# Patient Record
Sex: Female | Born: 2000
Health system: Southern US, Community
[De-identification: ages and names within clinical notes are randomized; demographics above are authoritative.]

## PROBLEM LIST (undated history)

## (undated) DIAGNOSIS — E0590 Thyrotoxicosis, unspecified without thyrotoxic crisis or storm: Secondary | ICD-10-CM

## (undated) DIAGNOSIS — G40909 Epilepsy, unspecified, not intractable, without status epilepticus: Secondary | ICD-10-CM

## (undated) HISTORY — PX: APPENDECTOMY: SHX54

## (undated) HISTORY — DX: Epilepsy, unspecified, not intractable, without status epilepticus: G40.909

## (undated) HISTORY — DX: Thyrotoxicosis, unspecified without thyrotoxic crisis or storm: E05.90

---
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
## 2021-07-20 ENCOUNTER — Other Ambulatory Visit: Payer: Self-pay

## 2021-07-20 ENCOUNTER — Encounter (HOSPITAL_COMMUNITY): Payer: Self-pay | Admitting: Emergency Medicine

## 2021-07-20 ENCOUNTER — Emergency Department (HOSPITAL_COMMUNITY)
Admission: EM | Admit: 2021-07-20 | Discharge: 2021-07-21 | Disposition: A | Discharge status: Home / Self Care | Payer: Medicaid Other | Attending: Emergency Medicine | Admitting: Emergency Medicine

## 2021-07-20 DIAGNOSIS — R569 Unspecified convulsions: Secondary | ICD-10-CM | POA: Insufficient documentation

## 2021-07-20 DIAGNOSIS — R000 Tachycardia, unspecified: Secondary | ICD-10-CM | POA: Insufficient documentation

## 2021-07-20 DIAGNOSIS — F1290 Cannabis use, unspecified, uncomplicated: Secondary | ICD-10-CM

## 2021-07-20 DIAGNOSIS — Z7722 Contact with and (suspected) exposure to environmental tobacco smoke (acute) (chronic): Secondary | ICD-10-CM | POA: Insufficient documentation

## 2021-07-20 DIAGNOSIS — E0590 Thyrotoxicosis, unspecified without thyrotoxic crisis or storm: Secondary | ICD-10-CM

## 2021-07-20 NOTE — ED Triage Notes (Addendum)
Per EMS, was in emotional distress from a family argument.  HR went down to170 in SVT.  Was given fluid HR down to 140 and ST.  Took some THC gummies.  Pupils are a 5, does not remember situation.  HR 140-150 18 G L AC CBG 93 130/80 RR 18  Given 500NS  Pt reports she "just woke up and there were people in my room doing tests on me."  Admits to taking "one gummy"  No other complains.

## 2021-07-20 NOTE — ED Provider Notes (Signed)
Emergency Medicine Provider Triage Evaluation Note  Megan Cruz , a 20 y.o. female  was evaluated in triage.  Pt brought in by EMS due to tachycardia. Pt reported took some THC gummies and felt her heart racing. Does not remember the events prior to EMS arrival. Per EMS patient appeared to be in emotional distress, and had HR in th 160-170s. EMS attempted vagal maneuvers for suspected SVT without relief. Gave 500cc fluid bolus and HR came down to 130s with rhythm more consistent with sinus tachycardia.   Patient states that her legs "feel heavy".  Per patient she says that she went to bed at about 4 PM, and woke up with "several people around her doing test".  She believes that her mother called EMS, but she is unsure why.  She states that she has never had a reaction like this to using THC.  No other reported drug use.  She admits to initially feeling like her heart was racing, but states she is not feeling this way anymore.  She is not complaining of any pain.  Review of Systems  Positive: Tachycardia, generalized weakness Negative: Chest pain, shortness of breath, dizziness  Physical Exam  BP 119/90 (BP Location: Right Arm)   Pulse (!) 134   Temp 99.8 F (37.7 C) (Oral)   Resp 16   Ht 5' (1.524 m)   Wt 68 kg   SpO2 100%   BMI 29.29 kg/m  Gen:   Awake, no distress   Resp:  Normal effort  MSK:   Moves extremities without difficulty  Other:  A&O x 4, pupils slightly dilated  Medical Decision Making  Medically screening exam initiated at 7:53 PM.  Appropriate orders placed.  ONEKA PARADA was informed that the remainder of the evaluation will be completed by another provider, this initial triage assessment does not replace that evaluation, and the importance of remaining in the ED until their evaluation is complete.    Jeanella Flattery 07/20/21 1953    Blane Ohara, MD 07/21/21 1154

## 2021-07-21 ENCOUNTER — Telehealth: Payer: Self-pay

## 2021-07-21 ENCOUNTER — Telehealth: Payer: Self-pay | Admitting: Endocrinology

## 2021-07-21 ENCOUNTER — Emergency Department (HOSPITAL_COMMUNITY): Payer: Medicaid Other

## 2021-07-21 LAB — CBC WITH DIFFERENTIAL/PLATELET
Abs Immature Granulocytes: 0.01 10*3/uL (ref 0.00–0.07)
Basophils Absolute: 0 10*3/uL (ref 0.0–0.1)
Basophils Relative: 0 %
Eosinophils Absolute: 0.4 10*3/uL (ref 0.0–0.5)
Eosinophils Relative: 7 %
HCT: 37.9 % (ref 36.0–46.0)
Hemoglobin: 12.3 g/dL (ref 12.0–15.0)
Immature Granulocytes: 0 %
Lymphocytes Relative: 58 %
Lymphs Abs: 3.5 10*3/uL (ref 0.7–4.0)
MCH: 27.2 pg (ref 26.0–34.0)
MCHC: 32.5 g/dL (ref 30.0–36.0)
MCV: 83.8 fL (ref 80.0–100.0)
Monocytes Absolute: 0.7 10*3/uL (ref 0.1–1.0)
Monocytes Relative: 12 %
Neutro Abs: 1.4 10*3/uL — ABNORMAL LOW (ref 1.7–7.7)
Neutrophils Relative %: 23 %
Platelets: 299 10*3/uL (ref 150–400)
RBC: 4.52 MIL/uL (ref 3.87–5.11)
RDW: 12.9 % (ref 11.5–15.5)
WBC: 6 10*3/uL (ref 4.0–10.5)
nRBC: 0 % (ref 0.0–0.2)

## 2021-07-21 LAB — BASIC METABOLIC PANEL
Anion gap: 7 (ref 5–15)
BUN: 6 mg/dL (ref 6–20)
CO2: 21 mmol/L — ABNORMAL LOW (ref 22–32)
Calcium: 8.7 mg/dL — ABNORMAL LOW (ref 8.9–10.3)
Chloride: 109 mmol/L (ref 98–111)
Creatinine, Ser: 0.41 mg/dL — ABNORMAL LOW (ref 0.44–1.00)
GFR, Estimated: >60 mL/min (ref >60)
Glucose, Bld: 88 mg/dL (ref 70–99)
Potassium: 3.9 mmol/L (ref 3.5–5.1)
Sodium: 137 mmol/L (ref 135–145)

## 2021-07-21 LAB — T4, FREE: Free T4: 4.02 ng/dL — ABNORMAL HIGH (ref 0.61–1.12)

## 2021-07-21 LAB — I-STAT BETA HCG BLOOD, ED (MC, WL, AP ONLY): I-stat hCG, quantitative: <5 m[IU]/mL (ref <5)

## 2021-07-21 LAB — TSH: TSH: <0.01 u[IU]/mL — ABNORMAL LOW (ref 0.350–4.500)

## 2021-07-21 MED ORDER — METHIMAZOLE 10 MG PO TABS: 20.0000 mg | ORAL_TABLET | Freq: Two times a day (BID) | ORAL | 2 refills | Status: DC

## 2021-07-21 NOTE — Telephone Encounter (Signed)
Hospital consult given to Dr. Everardo All to call Dr. Blane Ohara per ER attendant.

## 2021-07-21 NOTE — ED Provider Notes (Signed)
[facility] EMERGENCY DEPARTMENT Provider Note   CSN: [id number] Arrival date & time: 07/20/21  1929     History Chief Complaint  Patient presents with   Tachycardia    Megan Cruz is a 20 y.o. female.  Patient with history of seizure-like activity presents after mother noticed seizure-like activity.  There is a family argument and patient recalls going to her bedroom and soon after her mother found her generalized shaking lasting 1 to 2 minutes.  Patient does not recall though specific details but remembers waking up to ambulance people assessing her.  Patient currently has no signs or symptoms.  Patient does admit to using a gummy prior to this episode.  Patient states she does use THC Gummies in the past.  Patient denies any other drug use or alcohol use.  Patient has no blood clot history, not on birth control, no shortness of breath, no chest pain.  Patient had brief seizure-like activity in the past and had an EEG which she was told was unremarkable.  No seizure medications.      History reviewed. No pertinent past medical history.  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 Gerald Stabs, MD;  Location: [place];  Service: Pediatrics;  Laterality: Right;   LAPAROSCOPIC APPENDECTOMY N/A 07/22/2013   Procedure: APPENDECTOMY LAPAROSCOPIC;  Surgeon: Jerilynn Mages. Gerald Stabs, MD;  Location: [place];  Service: Pediatrics;  Laterality: N/A;     OB History     Gravida  0   Para  0   Term  0   Preterm  0   AB  0   Living         SAB  0   IAB  0   Ectopic  0   Multiple      Live Births              Family History  Problem Relation Age of Onset   Migraines Sister     Seizures Neg Hx    Autism Neg Hx    ADD / ADHD Neg Hx    Anxiety disorder Neg Hx    Depression Neg Hx    Bipolar disorder Neg Hx    Schizophrenia Neg Hx     Social History   Tobacco Use   Smoking status: Passive Smoke Exposure - Never Smoker   Smokeless tobacco: Never  Substance Use Topics   Alcohol use: No   Drug use: No    Home Medications Prior to Admission medications   Medication Sig Start Date End Date Taking? Authorizing Provider  HYDROcodone-acetaminophen (NORCO/VICODIN) 5-325 MG per tablet Take 1 tablet by mouth every 6 (six) hours as needed for moderate pain. Patient not taking: Reported on 01/22/2018 07/23/13   Gerald Stabs, MD    Allergies    Patient has no known allergies.  Review of Systems   Review of Systems  Constitutional:  Negative for chills and fever.  HENT:  Negative for congestion.   Eyes:  Negative for visual disturbance.  Respiratory:  Negative for shortness of breath.   Cardiovascular:  Negative for chest pain.  Gastrointestinal:  Negative for abdominal pain and vomiting.  Genitourinary:  Negative for dysuria and flank pain.  Musculoskeletal:  Negative for back pain, neck pain and neck stiffness.  Skin:  Negative for rash.  Neurological:  Positive for seizures. Negative for light-headedness and headaches.  Psychiatric/Behavioral:  The patient is nervous/anxious.    Physical Exam Updated Vital Signs BP 112/78   Pulse (!) 118   Temp 99.1 F (37.3 C)   Resp (!) 30   Ht 5' (1.524 m)   Wt 68 kg   SpO2 98%   BMI 29.29 kg/m   Physical Exam Vitals and nursing note reviewed.  Constitutional:      General: She is not in acute distress.    Appearance: She is well-developed.  HENT:     Head: Normocephalic and atraumatic.     Mouth/Throat:     Mouth: Mucous membranes are moist.  Eyes:     General:        Right eye: No discharge.        Left eye: No discharge.     Conjunctiva/sclera: Conjunctivae normal.  Neck:     Trachea: No  tracheal deviation.  Cardiovascular:     Rate and Rhythm: Regular rhythm. Tachycardia present.     Heart sounds: No murmur heard. Pulmonary:     Effort: Pulmonary effort is normal.     Breath sounds: Normal breath sounds.  Abdominal:     General: There is no distension.     Palpations: Abdomen is soft.     Tenderness: There is no abdominal tenderness. There is no guarding.  Musculoskeletal:     Cervical back: Normal range of motion and neck supple. No rigidity.  Skin:    General: Skin is warm.     Capillary Refill: Capillary refill takes less than 2 seconds.     Findings: No rash.  Neurological:     General: No focal deficit present.     Mental Status: She is alert.     Cranial Nerves: No cranial nerve deficit.     Sensory: No sensory deficit.     Motor: No weakness.     Coordination: Coordination normal.  Psychiatric:        Mood and Affect: Mood normal.    ED Results / Procedures / Treatments   Labs (all labs ordered are listed, but only abnormal results are displayed) Labs Reviewed  CBC WITH DIFFERENTIAL/PLATELET - Abnormal; Notable for the following components:      Result Value   Neutro Abs 1.4 (*)    All other components within normal limits  BASIC METABOLIC PANEL  TSH  I-STAT BETA HCG BLOOD, ED ([facility], [facility], [facility] ONLY)    EKG EKG Interpretation  Date/Time:  Tuesday July 20 2021 19:38:37 EST Ventricular Rate:  130 PR Interval:  158 QRS Duration: 76 QT Interval:  296 QTC Calculation: 435 R Axis:   68 Text Interpretation: Sinus tachycardia Otherwise normal ECG Confirmed by Blane Ohara [phone]) on 07/21/2021 10:37:26 AM  Radiology No results found.  Procedures Procedures   Medications Ordered in ED Medications - No data to display  ED Course  I have reviewed the triage vital signs and the nursing notes.  Pertinent labs & imaging results that were available during my care of the patient were reviewed by me and considered in my medical decision making  (see chart for details).    MDM Rules/Calculators/A&P                           Patient presents with EMS after seizure-like  activity.  Patient was involved in emotional conflict in the home however mother who I received permission from the patient and discussed on speaker phone with witnessed brief shaking episode.  Patient currently has no signs or symptoms.  Patient has been in the ER waiting to be seen all night.  Basic blood work added to check for anemia, electrolyte issues.  Patient admits to [facility] use and I advised against this as there could have been something else in it that led to this event.  No risk factors for blood clots.  Patient comfortable following outpatient discussed with mother as well.  TSH pending, CBC unremarkable normal hemoglobin no signs of anemia, normal white blood cell count.  hCG negative.  Oral fluids given, patient received IV fluid on route.  Heart rate improved from 130s to 90s in the room.  EKG sinus tachycardia.  Delay in pregnancy testing discussed with nursing.  Patient's thyroid studies returned abnormal TSH very low.  Patient's family history of hyperthyroidism as well.  Patient has no symptoms and signs on reassessment normal heart rate.  Discussed with endocrinology on-call will have their office call to set up an appointment.  Pregnancy test reviewed negative.  Endocrinologist recommend starting methimazole twice a day until seen in the office.   Final Clinical Impression(s) / ED Diagnoses Final diagnoses:  Marijuana use  Sinus tachycardia  Seizure-like activity [facility])    Rx / DC Orders ED Discharge Orders     None        Elnora Morrison, MD 07/21/21 [phone]

## 2021-07-21 NOTE — Telephone Encounter (Signed)
I was called by Dr Jodi Mourning: Pt in ER now, with abnormal TFT. Advise methimazole 20-BID.   Avoid pregnancy until advised this is safe.  Our office will call pt for appt here.   This may be months in the future, and will prob be with a different provider.     please contact patient: Needs new pt appt, next avail medicaid appt, any provider.

## 2021-07-21 NOTE — Discharge Instructions (Addendum)
Stay well-hydrated.  Avoid any drug use or alcohol use. Start taking methimazole as directed. Follow-up with neurology for further evaluation.  Avoid driving or operating machinery until cleared. Make sure you do not get pregnant while on this medicine. Follow up with endocrinology ([facility] office) as soon as possible.  Return for shortness of breath, chest pain, passing out, further seizure activity.

## 2021-07-22 ENCOUNTER — Encounter: Payer: Self-pay | Admitting: Neurology

## 2021-07-22 LAB — T3: T3, Total: 425 ng/dL — ABNORMAL HIGH (ref 71–180)

## 2021-07-22 LAB — T3, FREE: T3, Free: 14.4 pg/mL — ABNORMAL HIGH (ref 2.0–4.4)

## 2021-07-27 ENCOUNTER — Encounter: Payer: Self-pay | Admitting: Internal Medicine

## 2021-07-27 ENCOUNTER — Ambulatory Visit: Payer: Medicaid Other | Admitting: Internal Medicine

## 2021-07-27 VITALS — BP 122/74 | HR 80 | Ht 60.0 in | Wt 152.0 lb

## 2021-07-27 DIAGNOSIS — E0590 Thyrotoxicosis, unspecified without thyrotoxic crisis or storm: Secondary | ICD-10-CM

## 2021-07-27 LAB — COMPREHENSIVE METABOLIC PANEL WITH GFR
ALT: 16 U/L (ref 0–35)
AST: 16 U/L (ref 0–37)
Albumin: 3.8 g/dL (ref 3.5–5.2)
Alkaline Phosphatase: 88 U/L (ref 39–117)
BUN: 7 mg/dL (ref 6–23)
CO2: 24 meq/L (ref 19–32)
Calcium: 9.3 mg/dL (ref 8.4–10.5)
Chloride: 107 meq/L (ref 96–112)
Creatinine, Ser: 0.45 mg/dL (ref 0.40–1.20)
GFR: 137.79 mL/min
Glucose, Bld: 130 mg/dL — ABNORMAL HIGH (ref 70–99)
Potassium: 3.8 meq/L (ref 3.5–5.1)
Sodium: 138 meq/L (ref 135–145)
Total Bilirubin: 0.5 mg/dL (ref 0.2–1.2)
Total Protein: 6.4 g/dL (ref 6.0–8.3)

## 2021-07-27 LAB — CBC WITH DIFFERENTIAL/PLATELET
Basophils Absolute: 0 K/uL (ref 0.0–0.1)
Basophils Relative: 0.5 % (ref 0.0–3.0)
Eosinophils Absolute: 0.4 K/uL (ref 0.0–0.7)
Eosinophils Relative: 9.2 % — ABNORMAL HIGH (ref 0.0–5.0)
HCT: 41.4 % (ref 36.0–46.0)
Hemoglobin: 13.5 g/dL (ref 12.0–15.0)
Lymphocytes Relative: 37.3 % (ref 12.0–46.0)
Lymphs Abs: 1.8 K/uL (ref 0.7–4.0)
MCHC: 32.5 g/dL (ref 30.0–36.0)
MCV: 82 fl (ref 78.0–100.0)
Monocytes Absolute: 0.4 K/uL (ref 0.1–1.0)
Monocytes Relative: 8.1 % (ref 3.0–12.0)
Neutro Abs: 2.2 K/uL (ref 1.4–7.7)
Neutrophils Relative %: 44.9 % (ref 43.0–77.0)
Platelets: 333 K/uL (ref 150.0–400.0)
RBC: 5.05 Mil/uL (ref 3.87–5.11)
RDW: 13.2 % (ref 11.5–15.5)
WBC: 4.9 K/uL (ref 4.0–10.5)

## 2021-07-27 LAB — TSH: TSH: <0.01 u[IU]/mL — ABNORMAL LOW (ref 0.35–5.50)

## 2021-07-27 LAB — T4, FREE: Free T4: 2.53 ng/dL — ABNORMAL HIGH (ref 0.60–1.60)

## 2021-07-27 MED ORDER — ATENOLOL 25 MG PO TABS: 25.0000 mg | ORAL_TABLET | Freq: Every day | ORAL | 4 refills | Status: DC

## 2021-07-27 NOTE — Patient Instructions (Signed)
Start Atenolol 25 mg  ONCE daily  Continue Methimazole for now and I will send a portal message about any changes   If you develop severe sore throat with high fevers OR develop unexplained yellowing of your skin, eyes, under your tongue, severe abdominal pain with nausea or vomiting --> then please get evaluated immediately.

## 2021-07-27 NOTE — Progress Notes (Signed)
Name: Megan Cruz  MRN/ DOB: [id number], 04/15/2001    Age/ Sex: 20 y.o., female    PCP: Christel Mormon, MD   Reason for Endocrinology Evaluation: Hyperthyroidism     Date of Initial Endocrinology Evaluation: 07/27/2021     HPI: [name] is a 20 y.o. female with a past medical history of hyperthyrodism. The patient presented for initial endocrinology clinic visit on 07/27/2021 for consultative assistance with her Hyperthyroidism.   She was diagnosed with hyperthyroidism during evaluation of tachycardia on 07/21/2021 with a suppressed TSH <0.01 uIU/mL and elevated FT4 4.02 ng/dL and T3 at 623 ng/dL . She was started on Methimazole at the time.    Prior to starting methimazole she  did not notice any weight loss, local neck symptoms but noted hand tremors . No palpitations except the day she presented to the ED  Denies burning  or change in vision of the eyes but noted pruritis  Denies Biotin intake take  No recent viral infection  LMP 06/27/2021- regular  She is sexually active and does not use contraception , no plans of conception     She is currently on Methimazole 10 mg , 2 tabs BID     Sister with hypothyroidism     HISTORY:  Past Medical History: No past medical history on file. Past Surgical History:  Past Surgical History:  Procedure Laterality Date   APPENDECTOMY     CYST REMOVAL PEDIATRIC Right 07/22/2013   Procedure: CYST REMOVAL PEDIATRIC;  Surgeon: Judie Petit. Leonia Corona, MD;  [facility];  Service: Pediatrics;  Laterality: Right;   LAPAROSCOPIC APPENDECTOMY N/A 07/22/2013   Procedure: APPENDECTOMY LAPAROSCOPIC;  Surgeon: Judie Petit. Leonia Corona, MD;  [facility];  Service: Pediatrics;  Laterality: N/A;    Social History:  reports that she has never smoked. She has been exposed to tobacco smoke. She has never used smokeless tobacco. She reports that she does not drink alcohol and does not use drugs. Family History: family history includes  Migraines in her sister.   HOME MEDICATIONS: Allergies as of 07/27/2021   No Known Allergies      Medication List        Accurate as of July 27, 2021 10:25 AM. If you have any questions, ask your nurse or doctor.          STOP taking these medications    HYDROcodone-acetaminophen 5-325 MG tablet Commonly known as: NORCO/VICODIN Stopped by: Scarlette Shorts, MD       TAKE these medications    methimazole 10 MG tablet Commonly known as: TAPAZOLE Take 2 tablets (20 mg total) by mouth 2 (two) times daily.          REVIEW OF SYSTEMS: A comprehensive ROS was conducted with the patient and is negative except as per HPI     OBJECTIVE:  VS: BP 122/74 (BP Location: Left Arm, Patient Position: Sitting, Cuff Size: Small)   Pulse 80   Ht 5' (1.524 m)   Wt 152 lb (68.9 kg)   LMP 06/27/2021 (Within Days)   SpO2 99%   BMI 29.69 kg/m    Wt Readings from Last 3 Encounters:  07/27/21 152 lb (68.9 kg)  07/20/21 150 lb (68 kg)  01/22/18 139 lb 15.9 oz (63.5 kg) (76 %, Z= 0.72)*   * Growth percentiles are based on CDC (Girls, 2-20 Years) data.     EXAM: General: Pt appears well and is in NAD  Hydration: Well-hydrated with  moist mucous membranes and good skin turgor  Eyes: External eye exam normal without stare, lid lag or exophthalmos.  EOM intact.  PERRL.  Neck: General: Supple without adenopathy. Thyroid: Thyroid size normal.  No goiter or nodules appreciated  Lungs: Clear with good BS bilat with no rales, rhonchi, or wheezes  Heart: Auscultation: Tachycardic  Abdomen: Normoactive bowel sounds, soft, nontender, without masses or organomegaly palpable  Extremities:  BL LE: No pretibial edema normal ROM and strength.  Mental Status: Judgment, insight: Intact Orientation: Oriented to time, place, and person Mood and affect: No depression, anxiety, or agitation     DATA REVIEWED:     Latest Reference Range & Units 07/27/21 10:44  Sodium 135 - 145  mEq/L 138  Potassium 3.5 - 5.1 mEq/L 3.8  Chloride 96 - 112 mEq/L 107  CO2 19 - 32 mEq/L 24  Glucose 70 - 99 mg/dL 528 (H)  BUN 6 - 23 mg/dL 7  Creatinine 4.13 - 2.44 mg/dL 0.10  Calcium 8.4 - 27.2 mg/dL 9.3  Alkaline Phosphatase 39 - 117 U/L 88  Albumin 3.5 - 5.2 g/dL 3.8  AST 0 - 37 U/L 16  ALT 0 - 35 U/L 16  Total Protein 6.0 - 8.3 g/dL 6.4  Total Bilirubin 0.2 - 1.2 mg/dL 0.5  GFR >53.66 mL/min 137.79  WBC 4.0 - 10.5 K/uL 4.9  RBC 3.87 - 5.11 Mil/uL 5.05  Hemoglobin 12.0 - 15.0 g/dL 44.0  HCT 34.7 - 42.5 % 41.4  MCV 78.0 - 100.0 fl 82.0  MCHC 30.0 - 36.0 g/dL 95.6  RDW 38.7 - 56.4 % 13.2  Platelets 150.0 - 400.0 K/uL 333.0    Latest Reference Range & Units 07/27/21 10:44  Neutrophils 43.0 - 77.0 % 44.9  Lymphocytes 12.0 - 46.0 % 37.3  Monocytes Relative 3.0 - 12.0 % 8.1  Eosinophil 0.0 - 5.0 % 9.2 (H)  Basophil 0.0 - 3.0 % 0.5  NEUT# 1.4 - 7.7 K/uL 2.2  Lymphocyte # 0.7 - 4.0 K/uL 1.8  Monocyte # 0.1 - 1.0 K/uL 0.4  Eosinophils Absolute 0.0 - 0.7 K/uL 0.4  Basophils Absolute 0.0 - 0.1 K/uL 0.0  Glucose 70 - 99 mg/dL 332 (H)  TSH 9.51 - 8.84 uIU/mL <0.01 Repeated and verified X2. (L)  T4,Free(Direct) 0.60 - 1.60 ng/dL 1.66 (H)      ASSESSMENT/PLAN/RECOMMENDATIONS:   Hyperthyroidism:  - Pt is clinically hyperthyroid - NO local neck symptoms  -We discussed D/D of Graves' disease, subacute thyroiditis or toxic MNG -We discussed that Graves' Disease is a result of an autoimmune condition involving the thyroid.    We discussed with pt the benefits of methimazole in the Tx of hyperthyroidism, as well as the possible side effects/complications of anti-thyroid drug Tx (specifically detailing the rare, but serious side effect of agranulocytosis). She was informed of need for regular thyroid function monitoring while on methimazole to ensure appropriate dosage without over-treatment. As well, we discussed the possible side effects of methimazole including the chance of  rash, the small chance of liver irritation/juandice and the <=1 in 300-400 chance of sudden onset agranulocytosis.  We discussed importance of going to ED promptly (and stopping methimazole) if shewere to develop significant fever with severe sore throat of other evidence of acute infection.     We extensively discussed the various treatment options for hyperthyroidism and Graves disease including ablation therapy with radioactive iodine versus antithyroid drug treatment versus surgical therapy.  We recommended to the patient that we felt, at this  time, that thionamide therapy would be most optimal.  We discussed the various possible benefits versus side effects of the various therapies.   I carefully explained to the patient that one of the consequences of I-131 ablation treatment would likely be permanent hypothyroidism which would require long-term replacement therapy with LT4.  - Will start B-blocker due to tachycardia and palpitations  - Repeat TFT's today show FT4 trending down, no changes  - TRAb pending   Medications : Continue Methimazole 10 mg , 2 tabs BID Start Atenolol 25 mg daily    F/U in 3 months  Labs in 6 weeks   Signed electronically by: Lyndle Herrlich, MD  [facility]  [facility] [address] [place] [place], [place] [address] Phone: [phone] FAX: [phone]   CC: Christel Mormon, MD [address] [place] [address] Phone: [phone] Fax: [phone]   Return to Endocrinology clinic as below: Future Appointments  Date Time Provider Department Center  07/27/2021 10:30 AM Megan Cruz, Megan Dolores, MD [id number]  08/02/2021  1:00 PM Van Clines, MD [facility] None

## 2021-07-28 DIAGNOSIS — E0590 Thyrotoxicosis, unspecified without thyrotoxic crisis or storm: Secondary | ICD-10-CM | POA: Insufficient documentation

## 2021-07-28 MED ORDER — METHIMAZOLE 10 MG PO TABS: 20.0000 mg | ORAL_TABLET | Freq: Two times a day (BID) | ORAL | 3 refills | Status: DC

## 2021-07-29 LAB — TRAB (TSH RECEPTOR BINDING ANTIBODY): TRAB: 6.37 IU/L — ABNORMAL HIGH (ref <=2.00)

## 2021-08-02 ENCOUNTER — Ambulatory Visit: Payer: Medicaid Other | Admitting: Neurology

## 2021-08-02 ENCOUNTER — Encounter: Payer: Self-pay | Admitting: Neurology

## 2021-08-02 ENCOUNTER — Other Ambulatory Visit: Payer: Self-pay

## 2021-08-02 VITALS — BP 101/62 | HR 90 | Ht 60.0 in | Wt 153.4 lb

## 2021-08-02 DIAGNOSIS — R569 Unspecified convulsions: Secondary | ICD-10-CM | POA: Diagnosis not present

## 2021-08-02 NOTE — Progress Notes (Signed)
NEUROLOGY CONSULTATION NOTE  Megan Cruz MRN: [id number] DOB: 02/17/2001  Referring provider: Dr. Blane Ohara (ER) Primary care provider: Dr. Ivory Broad  Reason for consult:  seizure  Dear Dr Jodi Mourning:  Thank you for your kind referral of Megan Cruz for consultation of the above symptoms. Although her history is well known to you, please allow me to reiterate it for the purpose of our medical record. She is alone in the office today. Records and images were personally reviewed where available.   HISTORY OF PRESENT ILLNESS: This is a 20 year old right-handed woman with a history of newly diagnosed hyperthyroidism presenting for evaluation of seizures. She was evaluated by pediatric neurologist Dr. Devonne Doughty in 01/2018 after an episode of eye rolling, confusion, and brief jerking when her mother woke her up in the middle of the night to take care of a baby. She remembered jerking and was able to go back to sleep. She noted occasional brief jerking in her arms when she wakes up in the morning. EEG at that time was normal. She did well for several years until 07/20/21 when she was brought to the ER for seizure-like activity. There was a family argument and she recalled going to her bedroom where her mother found her having generalized shaking for 1-2 minutes. She denies any prior warning symptoms. She woke up in her bed and found she bit her tongue and both legs were weak. She admitted to taking a THC gummy prior to the event, which she has had in the past with no issues. She was tachycardic in the 160s-170s, given IV fluids.  Bloodwork showed normal CBC, BMP. Her TSH was very low at <0.010, free T4 was 4.02, T3 425, she was started on methimazole. She was seen by Endocrinology and started on atenolol. She lives with her mother. She denies any staring/unresponsive episodes, gaps in time, olfactory/gustatory hallucinations, deja vu, rising epigastric sensation, focal  numbness/tingling/weakness. She denies any headaches, dizziness, diplopia, dysarthria/dysphagia, neck/back pain, bowel/bladder dysfunction. Sleep is good until recently with her thyroid medications, she wakes up at 2am but goes back to sleep. She works at [employer]. Her brother had a single seizure. Otherwise she had a normal birth and early development.  There is no history of febrile convulsions, CNS infections such as meningitis/encephalitis, significant traumatic brain injury, neurosurgical procedures.  PAST MEDICAL HISTORY: Past Medical History:  Diagnosis Date   Hyperthyroidism     PAST SURGICAL HISTORY: Past Surgical History:  Procedure Laterality Date   APPENDECTOMY     CYST REMOVAL PEDIATRIC Right 07/22/2013   Procedure: CYST REMOVAL PEDIATRIC;  Surgeon: Judie Petit. Leonia Corona, MD;  [facility];  Service: Pediatrics;  Laterality: Right;   LAPAROSCOPIC APPENDECTOMY N/A 07/22/2013   Procedure: APPENDECTOMY LAPAROSCOPIC;  Surgeon: Judie Petit. Leonia Corona, MD;  Location[facility];  Service: Pediatrics;  Laterality: N/A;    MEDICATIONS: Current Outpatient Medications on File Prior to Visit  Medication Sig Dispense Refill   atenolol (TENORMIN) 25 MG tablet Take 1 tablet (25 mg total) by mouth daily. 30 tablet 4   methimazole (TAPAZOLE) 10 MG tablet Take 2 tablets (20 mg total) by mouth 2 (two) times daily. 120 tablet 3   No current facility-administered medications on file prior to visit.    ALLERGIES: No Known Allergies  FAMILY HISTORY: Family History  Problem Relation Age of Onset   Migraines Sister    Seizures Neg Hx    Autism Neg Hx    ADD / ADHD Neg  Hx    Anxiety disorder Neg Hx    Depression Neg Hx    Bipolar disorder Neg Hx    Schizophrenia Neg Hx     SOCIAL HISTORY: Social History   Socioeconomic History   Marital status: Single    Spouse name: Not on file   Number of children: Not on file   Years of education: Not on file   Highest education level: Not on file   Occupational History   Not on file  Tobacco Use   Smoking status: Never    Passive exposure: Yes   Smokeless tobacco: Never  Substance and Sexual Activity   Alcohol use: No   Drug use: No   Sexual activity: Never    Birth control/protection: None  Other Topics Concern   Not on file  Social History Narrative   Patient lives with mom and brother. She has graduated and is not attending college at the moment.        Social Determinants of Health   Financial Resource Strain: Not on file  Food Insecurity: Not on file  Transportation Needs: Not on file  Physical Activity: Not on file  Stress: Not on file  Social Connections: Not on file  Intimate Partner Violence: Not on file     PHYSICAL EXAM: Vitals:   08/02/21 1257  BP: 101/62  Pulse: 90  SpO2: 99%   General: No acute distress Head:  Normocephalic/atraumatic Skin/Extremities: No rash, no edema Neurological Exam: Mental status: alert and oriented to person, place, and time, no dysarthria or aphasia, Fund of knowledge is appropriate.  Recent and remote memory are intact, 3/3 delayed recall.  Attention and concentration are normal, 5/5 WORLD backwards.  Cranial nerves: CN I: not tested CN II: pupils equal, round and reactive to light, visual fields intact CN III, IV, VI:  full range of motion, no nystagmus, no ptosis CN V: facial sensation intact CN VII: upper and lower face symmetric CN VIII: hearing intact to conversation Bulk & Tone: normal, no fasciculations. Motor: 5/5 throughout with no pronator drift. Sensation: intact to light touch, cold, pin, vibration and joint position sense.  No extinction to double simultaneous stimulation.  Romberg test negative Deep Tendon Reflexes: brisk +2 throughout Cerebellar: no incoordination on finger to nose testing Gait: narrow-based and steady, able to tandem walk adequately. Tremor: none   IMPRESSION: This is a 20 year old right-handed woman with a history of newly  diagnosed hyperthyroidism presenting for evaluation of seizures. She had an episode of body jerking in 2019, EEG at that time normal. She was event-free for 3 years until 07/20/21 when her mother witnessed a GTC. 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 [place] driving laws were discussed with the patient, and she knows to stop driving after a seizure, until 6 months seizure-free. Follow-up in 4 months, call for any changes.    Thank you for allowing me to participate in the care of this patient. Please do not hesitate to call for any questions or concerns.   Patrcia Dolly, M.D.  CC: Dr. Jodi Mourning, Dr. Sabino Dick

## 2021-08-02 NOTE — Patient Instructions (Signed)
Good to meet you.  Schedule MRI brain with and without contrast  2. Schedule 1-hour EEG  3. Follow-up in 4 months, call for any changes   Seizure Precautions: 1. If medication has been prescribed for you to prevent seizures, take it exactly as directed.  Do not stop taking the medicine without talking to your doctor first, even if you have not had a seizure in a long time.   2. Avoid activities in which a seizure would cause danger to yourself or to others.  Don't operate dangerous machinery, swim alone, or climb in high or dangerous places, such as on ladders, roofs, or girders.  Do not drive unless your doctor says you may.  3. If you have any warning that you may have a seizure, lay down in a safe place where you can't hurt yourself.    4.  No driving for 6 months from last seizure, as per [facility].   Please refer to the following link on the Epilepsy Foundation of [employer]'s website for more information: http://www.epilepsyfoundation.org/answerplace/Social/driving/drivingu.cfm   5.  Maintain good sleep hygiene. Avoid alcohol.  6.  Notify your neurology if you are planning pregnancy or if you become pregnant.  7.  Contact your doctor if you have any problems that may be related to the medicine you are taking.  8.  Call 911 and bring the patient back to the ED if:        A.  The seizure lasts longer than 5 minutes.       B.  The patient doesn't awaken shortly after the seizure  C.  The patient has new problems such as difficulty seeing, speaking or moving  D.  The patient was injured during the seizure  E.  The patient has a temperature over 102 F (39C)  F.  The patient vomited and now is having trouble breathing

## 2021-08-03 ENCOUNTER — Ambulatory Visit: Payer: Medicaid Other | Admitting: Neurology

## 2021-08-03 ENCOUNTER — Telehealth: Payer: Self-pay | Admitting: Neurology

## 2021-08-03 DIAGNOSIS — R569 Unspecified convulsions: Secondary | ICD-10-CM | POA: Diagnosis not present

## 2021-08-03 NOTE — Telephone Encounter (Signed)
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 Patient called, left VM to discuss EEG results.

## 2021-08-04 ENCOUNTER — Other Ambulatory Visit: Payer: Medicaid Other

## 2021-08-04 MED ORDER — LEVETIRACETAM ER 500 MG PO TB24: ORAL_TABLET | ORAL | 6 refills | Status: DC

## 2021-08-04 NOTE — Telephone Encounter (Signed)
Discussed EEG results with patient, recommend starting Keppra XR 500mg : take 1 tablet every night for 1 week, then increase to 2 tablets every night. Side effects discussed. F/u as scheduled in 4 months, she knows to call for any changes.

## 2021-08-06 ENCOUNTER — Encounter: Payer: Self-pay | Admitting: Neurology

## 2021-08-09 ENCOUNTER — Telehealth: Payer: Self-pay | Admitting: Internal Medicine

## 2021-08-09 NOTE — Telephone Encounter (Signed)
Nurse Assessment Nurse: Johnna Acosta, RN, Helmut Muster Date/Time Lamount Cohen Time): 08/09/2021 12:04:34 PM Confirm and document reason for call. If symptomatic, describe symptoms. ---Caller states she has itching all over after taking her Atenolol. Areas are red. Caller states she read a message on MyChart that if this happens while taking this medication to stop taking for a week to see if the symptoms improve. Caller denies any other new or worsening symptoms. Caller refused triage at this time. Does the patient have any new or worsening symptoms? ---Yes Will a triage be completed? ---No Select reason for no triage. ---Patient declined Disp. Time Lamount Cohen Time) Disposition Final User 08/09/2021 11:38:51 AM Attempt made - message left Johnna Acosta, RN, Helmut Muster 08/09/2021 11:50:06 AM Attempt made - message left Theodore Demark 08/09/2021 12:07:13 PM Clinical Call Yes Johnna Acosta, RN, Helmut Muster Comments User: Donnelly Angelica Date/Time Lamount Cohen Time): 08/09/2021 11:34:53 AM Caller transferred from the office

## 2021-08-09 NOTE — Telephone Encounter (Signed)
Left vm for patient and also sent a mychart message with information.

## 2021-08-09 NOTE — Telephone Encounter (Signed)
Pt stated she thinks methimazole (TAPAZOLE) 10 MG tablet  and atenolol (TENORMIN) 25 MG tablet  is making her very itchy, she was transferred to triage. Also stated she thinks she may be pregnant but it is very hard to schedule with her pcp.

## 2021-08-10 ENCOUNTER — Encounter: Payer: Self-pay | Admitting: Internal Medicine

## 2021-08-10 MED ORDER — PROPYLTHIOURACIL 50 MG PO TABS: 50.0000 mg | ORAL_TABLET | Freq: Two times a day (BID) | ORAL | 1 refills | Status: DC

## 2021-08-10 NOTE — Procedures (Signed)
ELECTROENCEPHALOGRAM REPORT  Date of Study: 08/03/2021  Patient's Name: Megan Cruz MRN: [id number] Date of Birth: 23-Nov-2000  Referring Provider: Dr. Patrcia Dolly  Clinical History: This is a 20 year old woman with witnessed convulsion on 07/20/21. EEG for classification.  Medications: TENORMIN 25 MG tablet TAPAZOLE 10 MG tablet  Technical Summary: A multichannel digital 1-hour EEG recording measured by the international 10-20 system with electrodes applied with paste and impedances below 5000 ohms performed in our laboratory with EKG monitoring in an awake and asleep patient.  Hyperventilation was not performed. Photic stimulation was performed.  The digital EEG was referentially recorded, reformatted, and digitally filtered in a variety of bipolar and referential montages for optimal display.    Description: The patient is awake and asleep during the recording.  During maximal wakefulness, there is a symmetric, medium voltage 10.5 Hz posterior dominant rhythm that attenuates with eye opening.  The record is symmetric.  During drowsiness and sleep, there is an increase in theta slowing of the background.  Vertex waves and symmetric sleep spindles were seen.  Photic stimulation did not elicit any abnormalities.  There were occasional high voltage irregular generalized 4-5 Hz spike and polyspike and wave discharges with frontal predominance seen lasting 1-3 seconds without clinical correlate.There were no electrographic seizures seen.    EKG lead was unremarkable.  Impression: This 1-hour awake and asleep EEG is abnormal due to the presence of occasional generalized high voltage 4-5 Hz spike and polyspike and wave discharges lasting 1-3 seconds.  Clinical Correlation of the above findings is consistent with a diagnosis of primary generalized epilepsy. Clinical correlation is advised.   Patrcia Dolly, M.D.

## 2021-08-22 NOTE — L&D Delivery Note (Signed)
[facility]N [facility] Delivery Note  Megan Cruz is a 21 y.o. G1P0000 s/p SVD at [redacted]w[redacted]d. She was admitted for IOL due to hyperthyroidism.   ROM: 7h 69m with clear fluid GBS Status:  Negative/-- (07/24 1423) Maximum Maternal Temperature: afebrile  Labor Progress: Initial SVE: 1cm. She then progressed to complete.   Delivery Date/Time:  Delivery: Called to room and patient was complete and pushing. Head delivered LOA. No nuchal cord present. Shoulder and body delivered in usual fashion. Infant with spontaneous cry, placed on mother's abdomen, dried and stimulated. Cord clamped x 2 after 1-minute delay, and cut by father of the baby. Cord blood drawn. Placenta delivered spontaneously with gentle cord traction. Fundus firm with massage and Pitocin. Labia, perineum, vagina, and cervix inspected inspected with 2nd degree vaginal laceration.  Baby Weight: pending  Placenta: Sent to L&D Complications: None Lacerations: 2nd degree, repaired with 2-0 monocryl EBL: 257 mL Analgesia: Epidural   Infant:  APGAR (1 MIN): 9   APGAR (5 MINS): 9   APGAR (10 MINS):     [name], DO [facility] 04/04/2022, 10:59 AM

## 2021-08-24 ENCOUNTER — Ambulatory Visit
Admission: RE | Admit: 2021-08-24 | Discharge: 2021-08-24 | Disposition: A | Discharge status: Home / Self Care | Payer: Medicaid Other | Source: Ambulatory Visit | Attending: Neurology | Admitting: Neurology

## 2021-08-24 ENCOUNTER — Other Ambulatory Visit: Payer: Self-pay

## 2021-08-24 DIAGNOSIS — R569 Unspecified convulsions: Secondary | ICD-10-CM

## 2021-08-24 MED ORDER — GADOBENATE DIMEGLUMINE 529 MG/ML IV SOLN
14.0000 mL | Freq: Once | INTRAVENOUS | Status: AC | PRN
  Administered 2021-08-24: 14 mL via INTRAVENOUS

## 2021-09-07 ENCOUNTER — Other Ambulatory Visit (INDEPENDENT_AMBULATORY_CARE_PROVIDER_SITE_OTHER): Payer: Medicaid Other

## 2021-09-07 DIAGNOSIS — E0590 Thyrotoxicosis, unspecified without thyrotoxic crisis or storm: Secondary | ICD-10-CM

## 2021-09-08 LAB — TSH: TSH: 0 u[IU]/mL — ABNORMAL LOW (ref 0.35–5.50)

## 2021-09-08 LAB — T4, FREE: Free T4: 1.28 ng/dL (ref 0.60–1.60)

## 2021-09-09 ENCOUNTER — Telehealth: Payer: Self-pay | Admitting: Neurology

## 2021-09-09 MED ORDER — LEVETIRACETAM ER 500 MG PO TB24: ORAL_TABLET | ORAL | 6 refills | Status: DC

## 2021-09-09 NOTE — Addendum Note (Signed)
Addended by: Van Clines on: 09/09/2021 04:09 PM   Modules accepted: Orders

## 2021-09-09 NOTE — Telephone Encounter (Signed)
Pt called an informed increase the Keppra XR 500mg : Take 1 tablet in AM, 2 tablets in PM.  Pt reminded  that most common trigger for seizures is missing medication, keep an alarm and pillbox. Also pls let her know brain MRI did not show any tumor, stroke, or bleed. Will put on waitlist,

## 2021-09-09 NOTE — Telephone Encounter (Signed)
Pt c/o: seizure Missed medications?  Yes.   One dose Sleep deprived?  No. Alcohol intake?  No. Back to their usual baseline self?  Yes.  . Just a head ache  Any increase in stress? No Any triggers? No Current medications prescribed by Dr. Karel Jarvis: levetriacetam 500 mg  2 tablets every night

## 2021-09-09 NOTE — Telephone Encounter (Signed)
Patient called and said she had another seizure today and hasn't had another one since before her last appointment.   She said she called an ambulance and is doing okay now with no injuries.  Patient is requesting to be seen sooner than 12/14/21, if needed. Please advise?

## 2021-09-09 NOTE — Telephone Encounter (Signed)
Pls have her increase the Keppra XR 500mg : Take 1 tablet in AM, 2 tablets in PM. Pls send in updated Rx. Pls remind her that most common trigger for seizures is missing medication, keep an alarm and pillbox. Also pls let her know brain MRI did not show any tumor, stroke, or bleed. Will put on waitlist, thanks

## 2021-10-07 ENCOUNTER — Encounter: Payer: Self-pay | Admitting: Neurology

## 2021-10-07 ENCOUNTER — Other Ambulatory Visit: Payer: Self-pay | Admitting: Internal Medicine

## 2021-10-07 ENCOUNTER — Ambulatory Visit: Payer: Medicaid Other | Admitting: Neurology

## 2021-10-07 ENCOUNTER — Other Ambulatory Visit (INDEPENDENT_AMBULATORY_CARE_PROVIDER_SITE_OTHER): Payer: Medicaid Other

## 2021-10-07 ENCOUNTER — Other Ambulatory Visit: Payer: Self-pay

## 2021-10-07 VITALS — BP 114/78 | HR 94 | Ht 60.0 in | Wt 154.8 lb

## 2021-10-07 DIAGNOSIS — G40B09 Juvenile myoclonic epilepsy, not intractable, without status epilepticus: Secondary | ICD-10-CM | POA: Diagnosis not present

## 2021-10-07 DIAGNOSIS — Z3490 Encounter for supervision of normal pregnancy, unspecified, unspecified trimester: Secondary | ICD-10-CM

## 2021-10-07 NOTE — Progress Notes (Signed)
NEUROLOGY FOLLOW UP OFFICE NOTE  Megan Cruz [id number] 2001/05/28  HISTORY OF PRESENT ILLNESS: I had the pleasure of seeing Megan Cruz in follow-up in the neurology clinic on 10/07/2021.  The patient was last seen 2 months ago for seizures. She is alone in the office today.  Records and images were personally reviewed where available.  She had an episode concerning for seizure in 2019, event-free for 3 years until she had a witnessed GTC in 06/2021. I personally reviewed brain MRI with and without contrast done 08/2021 which did not show any acute changes, hippocampi symmetric with no abnormal signal or enhancement seen. There was note of very small foci of increased T2 FLAIR signal in the bilateral cerebral white matter. There was prominence of palatine tonsils and lymph nodes in the upper neck. Her 1-hour EEG in 07/2021 was abnormal due to occasional generalized high voltage 4-5 Hz spike and polyspike and wave discharges lasting 1-3 seconds consistent with a primary generalized epilepsy. She was started on Levetiracetam ER 1000mg  qhs in 07/2021, then called to report another seizure on 09/09/2021, Levetiracetam ER increased to 500mg  in AM, 1000mg  in PM. She had a convulsion during her nap at 4pm and bit her tongue. She denies any further seizures in the past month, no myoclonic jerks, staring/unresponsive episodes, olfactory/gustatory hallucinations, focal numbness/tingling/weakness. She reports that her LMP was in December and that she has taken a home pregnancy test which is positive. She has not seen OB yet. She feels okay with some nausea. She works at . She gets 4-5 hours of sleep then takes a nap in the afternoon after finishing her shift at [employer]. Mood is good.    History on Initial Assessment 08/02/2021: This is a 21 year old right-handed woman with a history of newly diagnosed hyperthyroidism presenting for evaluation of seizures. She was evaluated by pediatric neurologist  Dr. [employer] in 01/2018 after an episode of eye rolling, confusion, and brief jerking when her mother woke her up in the middle of the night to take care of a baby. She remembered jerking and was able to go back to sleep. She noted occasional brief jerking in her arms when she wakes up in the morning. EEG at that time was normal. She did well for several years until 07/20/21 when she was brought to the ER for seizure-like activity. There was a family argument and she recalled going to her bedroom where her mother found her having generalized shaking for 1-2 minutes. She denies any prior warning symptoms. She woke up in her bed and found she bit her tongue and both legs were weak. She admitted to taking a THC gummy prior to the event, which she has had in the past with no issues. She was tachycardic in the 160s-170s, given IV fluids.  Bloodwork showed normal CBC, BMP. Her TSH was very low at <0.010, free T4 was 4.02, T3 425, she was started on methimazole. She was seen by Endocrinology and started on atenolol. She lives with her mother. She denies any staring/unresponsive episodes, gaps in time, olfactory/gustatory hallucinations, deja vu, rising epigastric sensation, focal numbness/tingling/weakness. She denies any headaches, dizziness, diplopia, dysarthria/dysphagia, neck/back pain, bowel/bladder dysfunction. Sleep is good until recently with her thyroid medications, she wakes up at 2am but goes back to sleep. She works at [employer]. Her brother had a single seizure. Otherwise she had a normal birth and early development.  There is no history of febrile convulsions, CNS infections such as meningitis/encephalitis, significant traumatic brain  injury, neurosurgical procedures.   PAST MEDICAL HISTORY: Past Medical History:  Diagnosis Date   Hyperthyroidism     MEDICATIONS: Current Outpatient Medications on File Prior to Visit  Medication Sig Dispense Refill   levETIRAcetam (KEPPRA XR) 500 MG 24 hr tablet  Take 1 tablet in AM, 2 tablets in PM 90 tablet 6   No current facility-administered medications on file prior to visit.    ALLERGIES: No Known Allergies  FAMILY HISTORY: Family History  Problem Relation Age of Onset   Migraines Sister    Seizures Neg Hx    Autism Neg Hx    ADD / ADHD Neg Hx    Anxiety disorder Neg Hx    Depression Neg Hx    Bipolar disorder Neg Hx    Schizophrenia Neg Hx     SOCIAL HISTORY: Social History   Socioeconomic History   Marital status: Single    Spouse name: Not on file   Number of children: Not on file   Years of education: Not on file   Highest education level: Not on file  Occupational History   Not on file  Tobacco Use   Smoking status: Never    Passive exposure: Yes   Smokeless tobacco: Never  Vaping Use   Vaping Use: Never used  Substance and Sexual Activity   Alcohol use: No   Drug use: No   Sexual activity: Never    Birth control/protection: None  Other Topics Concern   Not on file  Social History Narrative   Patient lives with mom and brother. She has graduated and is not attending college at the moment.    Right handed        Social Determinants of Health   Financial Resource Strain: Not on file  Food Insecurity: Not on file  Transportation Needs: Not on file  Physical Activity: Not on file  Stress: Not on file  Social Connections: Not on file  Intimate Partner Violence: Not on file     PHYSICAL EXAM: Vitals:   10/07/21 0955  BP: 114/78  Pulse: 94  SpO2: 99%   General: No acute distress Head:  Normocephalic/atraumatic Skin/Extremities: No rash, no edema Neurological Exam: alert and awake. No aphasia or dysarthria. Fund of knowledge is appropriate. Attention and concentration are normal.   Cranial nerves: Pupils equal, round. Extraocular movements intact with no nystagmus. Visual fields full.  No facial asymmetry.  Motor: Bulk and tone normal, muscle strength 5/5 throughout with no pronator drift.   Finger to  nose testing intact.  Gait narrow-based and steady, able to tandem walk adequately.  Romberg negative.   IMPRESSION: This is a pleasant 21 yo RH woman with a history of hyperthyroidism, with Juvenile Myoclonic Epilepsy. EEG showed generalized 4-5 Hz spike and polyspike and wave discharges. MRI brain unremarkable. Last nocturnal seizure 09/09/2021, on increased dose now of Levetiracetam ER 500mg  in AM, 1000mg  in PM. She reports today that she had a positive home pregnancy test with LMP in December 2022. She has not seen OB yet. Discussed issues in women with epilepsy and pregnancy, will need to check Keppra level every month, check level today. Start daily prenatal vitamins and folic acid 1mg  daily. She is aware of [place] driving laws to stop driving after a seizure until 6 months seizure-free. She is asking for a referral to OB.  Follow-up in 3 months, call for any changes.  Thank you for allowing me to participate in her care.  Please do not hesitate to  call for any questions or concerns.   Patrcia Dolly, M.D.   CC: Dr. Sabino Dick

## 2021-10-07 NOTE — Patient Instructions (Signed)
Good to see you!  Check Keppra level today. You will need to have monthly bloodwork for Keppra level  2. Continue Keppra XR (Levetiracetam ER) 500mg : Take 1 tablet in AM, 2 tablets in PM  3. Start a daily prenatal vitamin and also a daily folic acid 1mg  tablet  4. Referral will be sent to OB  5. Follow-up in 3 months, call for any changes   Seizure Precautions: 1. If medication has been prescribed for you to prevent seizures, take it exactly as directed.  Do not stop taking the medicine without talking to your doctor first, even if you have not had a seizure in a long time.   2. Avoid activities in which a seizure would cause danger to yourself or to others.  Don't operate dangerous machinery, swim alone, or climb in high or dangerous places, such as on ladders, roofs, or girders.  Do not drive unless your doctor says you may.  3. If you have any warning that you may have a seizure, lay down in a safe place where you can't hurt yourself.    4.  No driving for 6 months from last seizure, as per [facility].   Please refer to the following link on the [place] website for more information: http://www.epilepsyfoundation.org/answerplace/Social/driving/drivingu.cfm   5.  Maintain good sleep hygiene. Avoid alcohol  6.  Notify your neurology if you are planning pregnancy or if you become pregnant.  7.  Contact your doctor if you have any problems that may be related to the medicine you are taking.  8.  Call 911 and bring the patient back to the ED if:        A.  The seizure lasts longer than 5 minutes.       B.  The patient doesn't awaken shortly after the seizure  C.  The patient has new problems such as difficulty seeing, speaking or moving  D.  The patient was injured during the seizure  E.  The patient has a temperature over 102 F (39C)  F.  The patient vomited and now is having trouble breathing

## 2021-10-13 LAB — LEVETIRACETAM LEVEL: Keppra (Levetiracetam): 6.9 ug/mL — ABNORMAL LOW

## 2021-10-14 ENCOUNTER — Other Ambulatory Visit: Payer: Self-pay | Admitting: Neurology

## 2021-10-14 ENCOUNTER — Telehealth: Payer: Self-pay

## 2021-10-14 ENCOUNTER — Other Ambulatory Visit: Payer: Self-pay

## 2021-10-14 ENCOUNTER — Ambulatory Visit (INDEPENDENT_AMBULATORY_CARE_PROVIDER_SITE_OTHER): Payer: Medicaid Other

## 2021-10-14 VITALS — BP 121/78 | HR 100 | Ht 60.0 in | Wt 154.0 lb

## 2021-10-14 DIAGNOSIS — Z3201 Encounter for pregnancy test, result positive: Secondary | ICD-10-CM | POA: Diagnosis not present

## 2021-10-14 LAB — POCT URINE PREGNANCY: Preg Test, Ur: POSITIVE — AB

## 2021-10-14 MED ORDER — LEVETIRACETAM ER 500 MG PO TB24: ORAL_TABLET | ORAL | 6 refills | Status: DC

## 2021-10-14 NOTE — Telephone Encounter (Signed)
Pt called an informed Keppra level is low, we want her protected especially during pregnancy. Increase Keppra ER 500mg : Take 2 tablets in AM, 2 tablets in PM. I sent in the Rx. She will need re-check of Keppra level next month. Pt verbalized understanding

## 2021-10-14 NOTE — Telephone Encounter (Signed)
-----   Message from Van Clines, MD sent at 10/14/2021  9:12 AM EST ----- Pls let her know the Keppra level is low, we want her protected especially during pregnancy. Increase Keppra ER 500mg : Take 2 tablets in AM, 2 tablets in PM. I sent in the Rx. She will need re-check of Keppra level next month. Thanks

## 2021-10-14 NOTE — Progress Notes (Signed)
Megan Cruz presents today for UPT. She has no unusual complaints. LMP:06/27/2021    OBJECTIVE: Appears well, in no apparent distress.  OB History     Gravida  1   Para  0   Term  0   Preterm  0   AB  0   Living         SAB  0   IAB  0   Ectopic  0   Multiple      Live Births             Home UPT Result: POSITIVE In-Office UPT result: POSITIVE  I have reviewed the patient's medical, obstetrical, social, and family histories, and medications.   ASSESSMENT: Positive pregnancy test LMP 06/27/2021 EDD  04/03/2022 [place]     [redacted]w[redacted]d  PLAN Prenatal care to be completed at: [facility]

## 2021-10-15 NOTE — Progress Notes (Signed)
Patient was assessed and managed by nursing staff during this encounter. I have reviewed the chart and agree with the documentation and plan. I have also made any necessary editorial changes. ° °Rollen Selders A Stuart Mirabile, MD °10/15/2021 11:26 AM   °

## 2021-10-20 ENCOUNTER — Ambulatory Visit (INDEPENDENT_AMBULATORY_CARE_PROVIDER_SITE_OTHER): Payer: Medicaid Other | Admitting: *Deleted

## 2021-10-20 DIAGNOSIS — Z3480 Encounter for supervision of other normal pregnancy, unspecified trimester: Secondary | ICD-10-CM

## 2021-10-20 DIAGNOSIS — O0990 Supervision of high risk pregnancy, unspecified, unspecified trimester: Secondary | ICD-10-CM | POA: Insufficient documentation

## 2021-10-20 MED ORDER — BLOOD PRESSURE MONITOR KIT
PACK | 0 refills | Status: DC
Start: 2021-10-20 — End: 2022-05-25

## 2021-10-20 MED ORDER — GOJJI WEIGHT SCALE MISC: 0 refills | Status: DC

## 2021-10-20 NOTE — Progress Notes (Signed)
New OB Intake ? ?I connected with  Megan Cruz on 10/20/21 at  2:00 PM EST by telephone Video Visit and verified that I am speaking with the correct person using two identifiers. Nurse is located at [facility] and pt is located at Home. ? ?I discussed the limitations, risks, security and privacy concerns of performing an evaluation and management service by telephone and the availability of in person appointments. I also discussed with the patient that there may be a patient responsible charge related to this service. The patient expressed understanding and agreed to proceed. ? ?I explained I am completing New OB Intake today. We discussed her EDD of 04/03/22 that is based on LMP of 06/27/21. Pt is G1/P0. I reviewed her allergies, medications, Medical/Surgical/OB history, and appropriate screenings. I informed her of [facility] services. Based on history, this is a  pregnancy uncomplicated .  ? ?Patient Active Problem List  ? Diagnosis Date Noted  ? Hyperthyroidism 07/28/2021  ? Seizure-like activity ([place]) 01/22/2018  ? 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 Acute gangrenous appendicitis 07/22/2013  ? ? ?Concerns addressed today ? ?Delivery Plans:  ?Plans to deliver at [facility].  ? ?Waterbirth candidate?  ? ?MyChart/Babyscripts ?MyChart access verified. I explained pt will have some visits in office and some virtually. Babyscripts instructions given and order placed. Patient verifies receipt of registration text/e-mail. Account successfully created and app downloaded. ? ?Blood Pressure Cuff  ?Blood pressure cuff ordered for patient to pick-up from [employer]. Explained after first prenatal appt pt will check weekly and document in 36. ? ?Weight scale: Patient does not  have weight scale. Weight scale ordered for patient to pick up from [employer].  ? ?Anatomy US ?Explained first scheduled [place] will be around 19 weeks.  ?Scheduled AFP lab only appointment if CenteringPregnancy pt for same day as anatomy US.   ? ?Labs ?Discussed Johnsie Cancel genetic screening with patient. Would like both Panorama and Horizon drawn at new OB visit.Also if interested in genetic testing, tell patient she will need AFP 15-21 weeks to complete genetic testing .Routine prenatal labs needed. ? ?Covid Vaccine ?Patient has covid vaccine.  ? ?Is patient a CenteringPregnancy candidate? Not a candidate  ? ?Is patient a Mom+Baby Combined Care candidate? Not a candidate    ? ?Informed patient of [facility] website  and placed link in her AVS.  ? ?Social Determinants of Health ?Food Insecurity: Patient denies food insecurity. ?WIC Referral: Patient is interested in referral to [facility].  ?Transportation: Patient denies transportation needs. ?Childcare: Discussed no children allowed at ultrasound appointments. Offered childcare services; patient declines childcare services at this time. ? ?Send link to Pregnancy Navigators ? ? ?Placed OB Box on problem list and updated ? ?First visit review ?I reviewed new OB appt with pt. I explained she will have a pelvic exam, ob bloodwork with genetic screening, and PAP smear. Explained pt will be seen by Dr Roselie Awkward at first visit; encounter routed to appropriate provider. Explained that patient will be seen by pregnancy navigator following visit with provider. [facility] information placed in AVS.  ? ?Lewie Loron Dellion, CMA ?10/20/2021  2:10 PM ? ?

## 2021-10-22 ENCOUNTER — Other Ambulatory Visit: Payer: Self-pay | Admitting: Internal Medicine

## 2021-10-26 ENCOUNTER — Other Ambulatory Visit: Payer: Self-pay

## 2021-10-26 ENCOUNTER — Encounter: Payer: Self-pay | Admitting: Obstetrics & Gynecology

## 2021-10-26 ENCOUNTER — Ambulatory Visit (INDEPENDENT_AMBULATORY_CARE_PROVIDER_SITE_OTHER): Payer: Medicaid Other | Admitting: Obstetrics & Gynecology

## 2021-10-26 ENCOUNTER — Other Ambulatory Visit (HOSPITAL_COMMUNITY)
Admission: RE | Admit: 2021-10-26 | Discharge: 2021-10-26 | Disposition: A | Discharge status: Home / Self Care | Payer: Medicaid Other | Source: Ambulatory Visit | Attending: Obstetrics & Gynecology | Admitting: Obstetrics & Gynecology

## 2021-10-26 VITALS — BP 124/76 | HR 114 | Wt 153.4 lb

## 2021-10-26 DIAGNOSIS — Z3480 Encounter for supervision of other normal pregnancy, unspecified trimester: Secondary | ICD-10-CM | POA: Diagnosis not present

## 2021-10-26 DIAGNOSIS — R569 Unspecified convulsions: Secondary | ICD-10-CM

## 2021-10-26 DIAGNOSIS — Z3A17 17 weeks gestation of pregnancy: Secondary | ICD-10-CM | POA: Diagnosis not present

## 2021-10-26 DIAGNOSIS — E0590 Thyrotoxicosis, unspecified without thyrotoxic crisis or storm: Secondary | ICD-10-CM

## 2021-10-26 NOTE — Progress Notes (Signed)
Pt presents for Initial OB visit. She has no questions or concerns at this time.  ?

## 2021-10-26 NOTE — Progress Notes (Signed)
?  Subjective:  ? ? Megan Cruz is a G1P0000 [redacted]w[redacted]d being seen today for her first obstetrical visit.  Her obstetrical history is significant for first pregnancy. Patient does intend to breast feed. Pregnancy history fully reviewed. ? ?Patient reports no complaints. ? ?Vitals:  ? 10/26/21 1059  ?BP: 124/76  ?Pulse: (!) 114  ?Weight: 153 lb 6.4 oz (69.6 kg)  ? ? ?HISTORY: ?OB History  ?Gravida Para Term Preterm AB Living  ?1 0 0 0 0    ?SAB IAB Ectopic Multiple Live Births  ?0 0 0      ?  ?# Outcome Date GA Lbr Len/2nd Weight Sex Delivery Anes PTL Lv  ?1 Current           ? ?Past Medical History:  ?Diagnosis Date  ?? Epilepsia [facility])   ?? Hyperthyroidism   ? ?Past Surgical History:  ?Procedure Laterality Date  ?? APPENDECTOMY    ?? CYST REMOVAL PEDIATRIC Right 07/22/2013  ? Procedure: CYST REMOVAL PEDIATRIC;  Surgeon: Judie Petit. Leonia Corona, MD;  Location: [facility] OR;  Service: Pediatrics;  Laterality: Right;  ?? LAPAROSCOPIC APPENDECTOMY N/A 07/22/2013  ? Procedure: APPENDECTOMY LAPAROSCOPIC;  Surgeon: Judie Petit. Leonia Corona, MD;  Location: [facility] OR;  Service: Pediatrics;  Laterality: N/A;  ? ?Family History  ?Problem Relation Age of Onset  ?? Migraines Sister   ?? Seizures Neg Hx   ?? Autism Neg Hx   ?? ADD / ADHD Neg Hx   ?? Anxiety disorder Neg Hx   ?? Depression Neg Hx   ?? Bipolar disorder Neg Hx   ?? Schizophrenia Neg Hx   ? ? ? ?Exam  ? ? ?Uterus:     ?Pelvic Exam:   ? Perineum: No Hemorrhoids  ? Vulva: normal  ? Vagina:  normal mucosa  ? pH:    ? Cervix: no lesions  ? Adnexa: normal adnexa  ? Bony Pelvis: average  ?System: Breast:  normal appearance, no masses or tenderness  ? Skin: normal coloration and turgor, no rashes ?  ? Neurologic: oriented, normal mood  ? Extremities: normal strength, tone, and muscle mass  ? HEENT PERRLA  ? Mouth/Teeth mucous membranes moist, pharynx normal without lesions  ? Neck supple  ? Cardiovascular: regular rate and rhythm, no murmurs or gallops  ? Respiratory:  appears well, vitals normal,  no respiratory distress, acyanotic, normal RR, neck free of mass or lymphadenopathy, chest clear, no wheezing, crepitations, rhonchi, normal symmetric air entry  ? Abdomen: soft, non-tender; bowel sounds normal; no masses,  no organomegaly  ? Urinary: urethral meatus normal  ? ? ?  ?Assessment:  ? ? Pregnancy: G1P0000 ?Patient Active Problem List  ? Diagnosis Date Noted  ?? Supervision of other normal pregnancy, antepartum 10/20/2021  ?? Hyperthyroidism 07/28/2021  ?? Seizure-like activity (HCC) 01/22/2018  ?? 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 Acute gangrenous appendicitis 07/22/2013  ? ?  ? ?  ?Plan:  ? ?  ?Initial labs drawn. ?Prenatal vitamins. ?Problem list reviewed and updated. ?Genetic Screening discussed : ordered. ? Ultrasound discussed; fetal survey: ordered. ? Follow up in 4 weeks. ?50% of 30 min visit spent on counseling and coordination of care.  ?Bedside US singleton pregnancy c/w EGA but no measurements done  ? ?Scheryl Darter ?10/26/2021 ? ? ?

## 2021-10-27 LAB — CERVICOVAGINAL ANCILLARY ONLY
Chlamydia: NEGATIVE
Comment: NEGATIVE
Comment: NORMAL
Neisseria Gonorrhea: NEGATIVE

## 2021-10-28 ENCOUNTER — Encounter: Payer: Self-pay | Admitting: Obstetrics & Gynecology

## 2021-10-28 DIAGNOSIS — R87612 Low grade squamous intraepithelial lesion on cytologic smear of cervix (LGSIL): Secondary | ICD-10-CM | POA: Insufficient documentation

## 2021-10-28 LAB — CBC/D/PLT+RPR+RH+ABO+RUBIGG...
Antibody Screen: NEGATIVE
Basophils Absolute: 0 10*3/uL (ref 0.0–0.2)
Basos: 0 %
EOS (ABSOLUTE): 0.2 10*3/uL (ref 0.0–0.4)
Eos: 2 %
HCV Ab: NONREACTIVE
HIV Screen 4th Generation wRfx: NONREACTIVE
Hematocrit: 38.7 % (ref 34.0–46.6)
Hemoglobin: 13.1 g/dL (ref 11.1–15.9)
Hepatitis B Surface Ag: NEGATIVE
Immature Grans (Abs): 0 10*3/uL (ref 0.0–0.1)
Immature Granulocytes: 0 %
Lymphocytes Absolute: 2.3 10*3/uL (ref 0.7–3.1)
Lymphs: 27 %
MCH: 28.5 pg (ref 26.6–33.0)
MCHC: 33.9 g/dL (ref 31.5–35.7)
MCV: 84 fL (ref 79–97)
Monocytes Absolute: 0.5 10*3/uL (ref 0.1–0.9)
Monocytes: 6 %
Neutrophils Absolute: 5.5 10*3/uL (ref 1.4–7.0)
Neutrophils: 65 %
Platelets: 330 10*3/uL (ref 150–450)
RBC: 4.6 x10E6/uL (ref 3.77–5.28)
RDW: 14 % (ref 11.7–15.4)
RPR Ser Ql: NONREACTIVE
Rh Factor: POSITIVE
Rubella Antibodies, IGG: 7.28 index (ref >0.99)
WBC: 8.5 10*3/uL (ref 3.4–10.8)

## 2021-10-28 LAB — AFP, SERUM, OPEN SPINA BIFIDA
AFP MoM: 0.65
AFP Value: 26.1 ng/mL
Gest. Age on Collection Date: 17.2 weeks
Maternal Age At EDD: 21.8 yr
OSBR Risk 1 IN: 10000
Test Results:: NEGATIVE
Weight: 153 [lb_av]

## 2021-10-28 LAB — CULTURE, OB URINE

## 2021-10-28 LAB — CYTOLOGY - PAP
Comment: NEGATIVE
High risk HPV: NEGATIVE

## 2021-10-28 LAB — HEMOGLOBIN A1C
Est. average glucose Bld gHb Est-mCnc: 97 mg/dL
Hgb A1c MFr Bld: 5 % (ref 4.8–5.6)

## 2021-10-28 LAB — URINE CULTURE, OB REFLEX

## 2021-10-28 LAB — HCV INTERPRETATION

## 2021-11-01 ENCOUNTER — Encounter: Payer: Self-pay | Admitting: Obstetrics & Gynecology

## 2021-11-02 ENCOUNTER — Other Ambulatory Visit: Payer: Self-pay

## 2021-11-02 ENCOUNTER — Ambulatory Visit: Payer: Medicaid Other | Admitting: Internal Medicine

## 2021-11-02 VITALS — BP 104/68 | HR 100 | Ht 60.0 in | Wt 157.0 lb

## 2021-11-02 DIAGNOSIS — E0500 Thyrotoxicosis with diffuse goiter without thyrotoxic crisis or storm: Secondary | ICD-10-CM | POA: Insufficient documentation

## 2021-11-02 DIAGNOSIS — E0590 Thyrotoxicosis, unspecified without thyrotoxic crisis or storm: Secondary | ICD-10-CM | POA: Diagnosis not present

## 2021-11-02 LAB — CBC WITH DIFFERENTIAL/PLATELET
Basophils Absolute: 0 10*3/uL (ref 0.0–0.1)
Basophils Relative: 0.2 % (ref 0.0–3.0)
Eosinophils Absolute: 0.3 10*3/uL (ref 0.0–0.7)
Eosinophils Relative: 2.8 % (ref 0.0–5.0)
HCT: 36.7 % (ref 36.0–46.0)
Hemoglobin: 12.4 g/dL (ref 12.0–15.0)
Lymphocytes Relative: 23.4 % (ref 12.0–46.0)
Lymphs Abs: 2.2 10*3/uL (ref 0.7–4.0)
MCHC: 33.8 g/dL (ref 30.0–36.0)
MCV: 83.4 fl (ref 78.0–100.0)
Monocytes Absolute: 0.6 10*3/uL (ref 0.1–1.0)
Monocytes Relative: 6.5 % (ref 3.0–12.0)
Neutro Abs: 6.3 10*3/uL (ref 1.4–7.7)
Neutrophils Relative %: 67.1 % (ref 43.0–77.0)
Platelets: 310 10*3/uL (ref 150.0–400.0)
RBC: 4.4 Mil/uL (ref 3.87–5.11)
RDW: 14.7 % (ref 11.5–15.5)
WBC: 9.4 10*3/uL (ref 4.0–10.5)

## 2021-11-02 LAB — COMPREHENSIVE METABOLIC PANEL
ALT: 9 U/L (ref 0–35)
AST: 11 U/L (ref 0–37)
Albumin: 3.5 g/dL (ref 3.5–5.2)
Alkaline Phosphatase: 68 U/L (ref 39–117)
BUN: 5 mg/dL — ABNORMAL LOW (ref 6–23)
CO2: 22 mEq/L (ref 19–32)
Calcium: 9.4 mg/dL (ref 8.4–10.5)
Chloride: 106 mEq/L (ref 96–112)
Creatinine, Ser: 0.44 mg/dL (ref 0.40–1.20)
GFR: 138.27 mL/min (ref >60.00)
Glucose, Bld: 89 mg/dL (ref 70–99)
Potassium: 4 mEq/L (ref 3.5–5.1)
Sodium: 136 mEq/L (ref 135–145)
Total Bilirubin: 0.3 mg/dL (ref 0.2–1.2)
Total Protein: 5.8 g/dL — ABNORMAL LOW (ref 6.0–8.3)

## 2021-11-02 LAB — TSH: TSH: <0.01 u[IU]/mL — ABNORMAL LOW (ref 0.35–5.50)

## 2021-11-02 NOTE — Progress Notes (Signed)
? ? ?Name: Megan Cruz  ?MRN/ DOB: [id number], Apr 18, 2001    ?Age/ Sex: 21 y.o., female   ? ?PCP: Angeline Slim, MD   ?Reason for Endocrinology Evaluation: Hyperthyroidism  ?   ?Date of Initial Endocrinology Evaluation: 11/02/2021   ? ? ?HPI: ?[name] is a 20 y.o. female with a past medical history of hyperthyrodism. The patient presented for initial endocrinology clinic visit on 11/02/2021 for consultative assistance with her Hyperthyroidism.  ? ?She was diagnosed with hyperthyroidism during evaluation of tachycardia on 07/21/2021 with a suppressed TSH <0.01 uIU/mL and elevated FT4 4.02 ng/dL and T3 at 425 ng/dL . She was started on Methimazole at the time.  ? ? ?Prior to starting methimazole she  did not notice any weight loss, local neck symptoms but noted hand tremors . No palpitations except the day she presented to the ED ? ? ?On her initial visit to  our clinic she was on Methimazole but we switched to PTU shortly after pregnancy diagnosis in 07/2021 ? ?TRAB elevated at 6.37 IU/L ?Sister with hypothyroidism  ? ?SUBJECTIVE:  ? ? ?Today (11/02/21):  [name] is here for a follow up for hyperthyroidism during pregnancy.  ? ?She is currently 18.2 weeks of gestation  ?EDD 04/03/2022- baby girl  ?Women's Physician  ? ? ?Has been noted with weight gain  ?She has nausea , vomiting is improving  ?Denies fever or sore throat ?Denies palpitations  ?Denies tremors  ?Denies local neck swelling  ? ? ? ? ? ?PTU 50 mg BID ? ? ?HISTORY:  ?Past Medical History:  ?Past Medical History:  ?Diagnosis Date  ? Epilepsia [facility])   ? Hyperthyroidism   ? ?Past Surgical History:  ?Past Surgical History:  ?Procedure Laterality Date  ? APPENDECTOMY    ? CYST REMOVAL PEDIATRIC Right 07/22/2013  ? Procedure: CYST REMOVAL PEDIATRIC;  Surgeon: Jerilynn Mages. Gerald Stabs, MD;  Location: [place];  Service: Pediatrics;  Laterality: Right;  ? LAPAROSCOPIC APPENDECTOMY N/A 07/22/2013  ? Procedure: APPENDECTOMY LAPAROSCOPIC;  Surgeon: Jerilynn Mages.  Gerald Stabs, MD;  Location: [place];  Service: Pediatrics;  Laterality: N/A;  ?  ?Social History:  reports that she has never smoked. She has been exposed to tobacco smoke. She has never used smokeless tobacco. She reports that she does not drink alcohol and does not use drugs. ?Family History: family history includes Migraines in her sister. ? ? ?HOME MEDICATIONS: ?Allergies as of 11/02/2021   ?No Known Allergies ?  ? ?  ?Medication List  ?  ? ?  ? Accurate as of November 02, 2021  7:28 AM. If you have any questions, ask your nurse or doctor.  ?  ?  ? ?  ? ?Blood Pressure Monitor Kit ?Use blood pressure monitor to check BP weekly ?  ?Gojji Weight Scale Misc ?Use scale to check weight once weekly ?  ?levETIRAcetam 500 MG 24 hr tablet ?Commonly known as: KEPPRA XR ?Take 2 tablets twice a day ?  ?prenatal multivitamin Tabs tablet ?Take 1 tablet by mouth daily at 12 noon. ?  ?propylthiouracil 50 MG tablet ?Commonly known as: PTU ?TAKE 1 TABLET(50 MG) BY MOUTH TWICE DAILY ?  ? ?  ?  ? ? ?REVIEW OF SYSTEMS: ?A comprehensive ROS was conducted with the patient and is negative except as per HPI  ? ? ? ?OBJECTIVE:  ?VS: LMP 06/27/2021 (Within Months)   ? ?Wt Readings from Last 3 Encounters:  ?10/26/21 153 lb 6.4 oz (69.6 kg)  ?  10/14/21 154 lb (69.9 kg)  ?10/07/21 154 lb 12.8 oz (70.2 kg)  ? ? ? ?EXAM: ?General: Pt appears well and is in NAD  ?Hydration: Well-hydrated with moist mucous membranes and good skin turgor  ?Eyes: External eye exam normal without stare, lid lag or exophthalmos.  EOM intact.  PERRL.  ?Neck: General: Supple without adenopathy. ?Thyroid: Thyroid size normal.  No goiter or nodules appreciated  ?Lungs: Clear with good BS bilat with no rales, rhonchi, or wheezes  ?Heart: Auscultation: Tachycardic  ?Abdomen: Normoactive bowel sounds, soft, nontender, without masses or organomegaly palpable  ?Extremities:  ?BL LE: No pretibial edema normal ROM and strength.  ?Mental Status: Judgment, insight:  Intact ?Orientation: Oriented to time, place, and person ?Mood and affect: No depression, anxiety, or agitation  ? ? ? ?DATA REVIEWED: ? Latest Reference Range & Units 11/02/21 09:56  ?Sodium 135 - 145 mEq/L 136  ?Potassium 3.5 - 5.1 mEq/L 4.0  ?Chloride 96 - 112 mEq/L 106  ?CO2 19 - 32 mEq/L 22  ?Glucose 70 - 99 mg/dL 89  ?BUN 6 - 23 mg/dL 5 (L)  ?Creatinine 0.40 - 1.20 mg/dL 0.44  ?Calcium 8.4 - 10.5 mg/dL 9.4  ?Alkaline Phosphatase 39 - 117 U/L 68  ?Albumin 3.5 - 5.2 g/dL 3.5  ?AST 0 - 37 U/L 11  ?ALT 0 - 35 U/L 9  ?Total Protein 6.0 - 8.3 g/dL 5.8 (L)  ?Total Bilirubin 0.2 - 1.2 mg/dL 0.3  ?GFR >60.00 mL/min 138.27  ?WBC 4.0 - 10.5 K/uL 9.4  ?RBC 3.87 - 5.11 Mil/uL 4.40  ?Hemoglobin 12.0 - 15.0 g/dL 12.4  ?HCT 36.0 - 46.0 % 36.7  ?MCV 78.0 - 100.0 fl 83.4  ?MCHC 30.0 - 36.0 g/dL 33.8  ?RDW 11.5 - 15.5 % 14.7  ?Platelets 150.0 - 400.0 K/uL 310.0  ?Neutrophils 43.0 - 77.0 % 67.1  ?Lymphocytes 12.0 - 46.0 % 23.4  ?Monocytes Relative 3.0 - 12.0 % 6.5  ?Eosinophil 0.0 - 5.0 % 2.8  ?Basophil 0.0 - 3.0 % 0.2  ?NEUT# 1.4 - 7.7 K/uL 6.3  ?Lymphocyte # 0.7 - 4.0 K/uL 2.2  ?Monocyte # 0.1 - 1.0 K/uL 0.6  ?Eosinophils Absolute 0.0 - 0.7 K/uL 0.3  ?Basophils Absolute 0.0 - 0.1 K/uL 0.0  ? ? ? Latest Reference Range & Units 11/02/21 09:56  ?TSH 0.35 - 5.50 uIU/mL <0.01 (L)  ?Thyroxine (T4) 5.1 - 11.9 mcg/dL 18.1 (H)  ? ? ? Latest Reference Range & Units Most Recent  ?TRAB <=2.00 IU/L 6.37 (H) ?07/27/21 10:44  ? ? ?ASSESSMENT/PLAN/RECOMMENDATIONS:  ? ?Hyperthyroidism: ? ?- Pt is clinically hyperthyroid ?- NO local neck symptoms  ?-The goal of treatment is to maintain persistent but mild hyperthyroidism in the mother in an attempt to prevent fetal hypothyroidism since the fetal thyroid is more sensitive to the action of thionamide therapy. Overtreatment of maternal hyperthyroidism can cause fetal goiter and primary hypothyroidism.  ? ? ?Medications : ?Stop PTU  ?Start Methimazole 5 mg, 3 tabs daily ? ? ?2. Graves' Disease:  ? ?-  No extra thyroidal manifestations of Graves' disease ? ? ?F/U in 2 months  ?Labs in 2 weeks  ? ?Signed electronically by: ?Abby Nena Jordan, MD ? ?[facility]  ?[facility] ?[address] ?[place], [place] [address] ?Phone: [phone] ?FAX: [phone] ? ? ?CC: ?Coccaro, Raelyn Ensign, MD ?[address] ?[place] [address] ?Phone: [phone] ?Fax: [phone] ? ? ?Return to Endocrinology clinic as below: ?Future Appointments  ?Date Time  Provider Department Center  ?11/02/2021  9:30 AM Mylynn Dinh, Melanie Crazier, MD [facility] None  ?11/10/2021 12:45 PM [facility] US5 [facility]  ?11/23/2021  8:35 AM Chancy Milroy, MD [facility] None  ?01/07/2022 11:30 AM Cameron Sprang, MD [facility] None  ?  ? ? ? ? ? ?

## 2021-11-02 NOTE — Patient Instructions (Signed)
Will switch PTU to Methimazole now that you are in the second Trimester  ?

## 2021-11-03 LAB — T4: T4, Total: 18.1 ug/dL — ABNORMAL HIGH (ref 5.1–11.9)

## 2021-11-03 MED ORDER — METHIMAZOLE 5 MG PO TABS
5.0000 mg | ORAL_TABLET | Freq: Three times a day (TID) | ORAL | 4 refills | Status: DC
Start: 2021-11-03 — End: 2021-11-16

## 2021-11-08 ENCOUNTER — Encounter: Payer: Self-pay | Admitting: Obstetrics & Gynecology

## 2021-11-10 ENCOUNTER — Other Ambulatory Visit: Payer: Self-pay | Admitting: *Deleted

## 2021-11-10 ENCOUNTER — Other Ambulatory Visit: Payer: Self-pay

## 2021-11-10 ENCOUNTER — Ambulatory Visit: Payer: Medicaid Other | Attending: Obstetrics & Gynecology

## 2021-11-10 ENCOUNTER — Ambulatory Visit: Payer: Medicaid Other | Attending: Obstetrics & Gynecology | Admitting: Obstetrics

## 2021-11-10 ENCOUNTER — Other Ambulatory Visit: Payer: Self-pay | Admitting: Obstetrics & Gynecology

## 2021-11-10 DIAGNOSIS — O99352 Diseases of the nervous system complicating pregnancy, second trimester: Secondary | ICD-10-CM

## 2021-11-10 DIAGNOSIS — E0590 Thyrotoxicosis, unspecified without thyrotoxic crisis or storm: Secondary | ICD-10-CM | POA: Insufficient documentation

## 2021-11-10 DIAGNOSIS — O99282 Endocrine, nutritional and metabolic diseases complicating pregnancy, second trimester: Secondary | ICD-10-CM | POA: Diagnosis not present

## 2021-11-10 DIAGNOSIS — Z3A18 18 weeks gestation of pregnancy: Secondary | ICD-10-CM | POA: Insufficient documentation

## 2021-11-10 DIAGNOSIS — Z3480 Encounter for supervision of other normal pregnancy, unspecified trimester: Secondary | ICD-10-CM | POA: Diagnosis not present

## 2021-11-10 DIAGNOSIS — O321XX0 Maternal care for breech presentation, not applicable or unspecified: Secondary | ICD-10-CM | POA: Diagnosis not present

## 2021-11-10 DIAGNOSIS — Z363 Encounter for antenatal screening for malformations: Secondary | ICD-10-CM | POA: Diagnosis present

## 2021-11-10 DIAGNOSIS — G40909 Epilepsy, unspecified, not intractable, without status epilepticus: Secondary | ICD-10-CM

## 2021-11-10 NOTE — Progress Notes (Signed)
MFM Note ? ?Megan Cruz was seen for a detailed fetal anatomy scan due to a history of hyperthyroidism and seizure disorder that is treated with Keppra.  She reports that she was started on methimazole for treatment of hyperthyroidism a few days ago.  Her TSH level drawn  one month ago was less than 0.01 and her T4 level was elevated.  She is being followed by a neurologist for the management of her seizure disorder. ? ?She denies any other significant past medical history and denies any problems in her current pregnancy.   ? ?She had a cell free DNA test earlier in her pregnancy which indicated a low risk for trisomy 11, 88, and 13. A female fetus is predicted.  ? ?Based on the fetal biometry measurements obtained today, her EDC was changed to April 09, 2022, making her 18 weeks and 4 days pregnant today. ? ?There were no obvious fetal anomalies noted on today's ultrasound exam.  However, today's exam was limited due to the fetal position and her early gestational age. ? ?The patient was informed that anomalies may be missed due to technical limitations. If the fetus is in a suboptimal position or maternal habitus is increased, visualization of the fetus in the maternal uterus may be impaired. ? ?The following were discussed during today's consultation: ? ?Hyperthyroidism in pregnancy  ? ?The implications and management of hyperthyroidism in pregnancy was discussed with the patient.   ? ?She was advised that uncontrolled hyperthyroidism is associated with significant adverse pregnancy outcomes in the mother such as the development of preeclampsia and thyroid storm.   ? ?In the fetus, uncontrolled hyperthyroidism may result in fetal tachycardia, fetal growth restriction, and stillbirths.   ? ?The patient was advised to continue taking methimazole as prescribed for the duration of pregnancy.   ? ?She should have her thyroid function tests monitored once a month to ensure that she is adequately treated.   ? ?The  dose of methimazole should be adjusted based on her thyroid function tests.   ? ?Weekly fetal testing should be started at around 32 weeks.   ? ?The patient was advised that maintaining normal thyroid function throughout her pregnancy will provide her with the most optimal obstetrical outcomes. ? ?We will continue to follow her closely with monthly growth scans. ? ?Seizure disorder in pregnancy ? ?The patient was advised to continue taking Keppra for treatment of her seizure disorder as recommended by her neurologist. ? ?As the serum level of antiepileptic drugs may decrease during pregnancy due to increased metabolism, the antiepileptic drug level should be monitored during pregnancy.  Adjustments to the dosage of her antiepileptic medication may be needed during pregnancy if the serum levels are not in the therapeutic range. ? ?The patient was reassured that most women with seizure disorders treated with antiepileptic medications have a normal pregnancy outcome.  ? ?A follow-up exam was scheduled in 4 weeks to assess the fetal growth and to reassess the fetal anatomy.  ? ?The patient stated that all of her questions were answered today. ? ?A total of 30 minutes was spent counseling and coordinating the care for this patient.  Greater than 50% of the time was spent in direct face-to-face contact. ?

## 2021-11-15 ENCOUNTER — Other Ambulatory Visit (INDEPENDENT_AMBULATORY_CARE_PROVIDER_SITE_OTHER): Payer: Medicaid Other

## 2021-11-15 ENCOUNTER — Other Ambulatory Visit: Payer: Self-pay

## 2021-11-15 DIAGNOSIS — E0590 Thyrotoxicosis, unspecified without thyrotoxic crisis or storm: Secondary | ICD-10-CM

## 2021-11-16 ENCOUNTER — Other Ambulatory Visit: Payer: Self-pay | Admitting: Internal Medicine

## 2021-11-16 LAB — TSH: TSH: <0.01 u[IU]/mL — ABNORMAL LOW (ref 0.35–5.50)

## 2021-11-16 LAB — T4: T4, Total: 16.8 ug/dL — ABNORMAL HIGH (ref 5.1–11.9)

## 2021-11-16 MED ORDER — METHIMAZOLE 5 MG PO TABS: 10.0000 mg | ORAL_TABLET | Freq: Two times a day (BID) | ORAL | 4 refills | Status: DC

## 2021-11-23 ENCOUNTER — Encounter: Payer: Self-pay | Admitting: Obstetrics and Gynecology

## 2021-11-23 ENCOUNTER — Ambulatory Visit (INDEPENDENT_AMBULATORY_CARE_PROVIDER_SITE_OTHER): Payer: Medicaid Other | Admitting: Obstetrics and Gynecology

## 2021-11-23 VITALS — BP 113/61 | HR 90 | Wt 158.8 lb

## 2021-11-23 DIAGNOSIS — R569 Unspecified convulsions: Secondary | ICD-10-CM

## 2021-11-23 DIAGNOSIS — Z3480 Encounter for supervision of other normal pregnancy, unspecified trimester: Secondary | ICD-10-CM

## 2021-11-23 DIAGNOSIS — E0590 Thyrotoxicosis, unspecified without thyrotoxic crisis or storm: Secondary | ICD-10-CM

## 2021-11-23 DIAGNOSIS — R87612 Low grade squamous intraepithelial lesion on cytologic smear of cervix (LGSIL): Secondary | ICD-10-CM

## 2021-11-23 NOTE — Patient Instructions (Signed)

## 2021-11-23 NOTE — Progress Notes (Signed)
Subjective:  ?Megan Cruz is a 21 y.o. G1P0000 at [redacted]w[redacted]d being seen today for ongoing prenatal care.  She is currently monitored for the following issues for this high-risk pregnancy and has Seizure-like activity ([place]); Parasomnia; Hyperthyroidism; Supervision of other normal pregnancy, antepartum; LGSIL on Pap smear of cervix; and Graves disease on their problem list. ? ?Patient reports no complaints.  Contractions: Not present. Vag. Bleeding: None.  Movement: Present. Denies leaking of fluid.  ? ?The following portions of the patient's history were reviewed and updated as appropriate: allergies, current medications, past family history, past medical history, past social history, past surgical history and problem list. Problem list updated. ? ?Objective:  ? ?Vitals:  ? 11/23/21 0848  ?BP: 113/61  ?Pulse: 90  ?Weight: 158 lb 12.8 oz (72 kg)  ? ? ?Fetal Status: Fetal Heart Rate (bpm): 154   Movement: Present    ? ?General:  Alert, oriented and cooperative. Patient is in no acute distress.  ?Skin: Skin is warm and dry. No rash noted.   ?Cardiovascular: Normal heart rate noted  ?Respiratory: Normal respiratory effort, no problems with respiration noted  ?Abdomen: Soft, gravid, appropriate for gestational age. Pain/Pressure: Absent     ?Pelvic:  Cervical exam deferred        ?Extremities: Normal range of motion.  Edema: None  ?Mental Status: Normal mood and affect. Normal behavior. Normal judgment and thought content.  ? ?Urinalysis:     ? ?Assessment and Plan:  ?Pregnancy: G1P0000 at [redacted]w[redacted]d ? ?1. Supervision of other normal pregnancy, antepartum ?Stable ?Anatomy scan this week ? ?2. Hyperthyroidism ?Stable ?Followed by [facility] ? ?3. Seizure-like activity ([place]) ?Stable ?Followed by neurology ? ?4. LGSIL on Pap smear of cervix ?Repeat in 1 yr ? ?Preterm labor symptoms and general obstetric precautions including but not limited to vaginal bleeding, contractions, leaking of fluid and fetal movement were reviewed in  detail with the patient. ?Please refer to After Visit Summary for other counseling recommendations.  ?Return in about 4 weeks (around 12/21/2021) for OB visit, face to face, MD only. ? ? ?Chancy Milroy, MD ?

## 2021-11-23 NOTE — Progress Notes (Signed)
Pt reports feeling a little fetal movement, denies pain. 

## 2021-12-06 ENCOUNTER — Ambulatory Visit: Payer: Medicaid Other | Attending: Obstetrics

## 2021-12-06 ENCOUNTER — Other Ambulatory Visit: Payer: Self-pay | Admitting: *Deleted

## 2021-12-06 ENCOUNTER — Ambulatory Visit: Payer: Medicaid Other | Admitting: *Deleted

## 2021-12-06 VITALS — BP 116/62 | HR 87

## 2021-12-06 DIAGNOSIS — E0590 Thyrotoxicosis, unspecified without thyrotoxic crisis or storm: Secondary | ICD-10-CM | POA: Insufficient documentation

## 2021-12-06 DIAGNOSIS — Z3A22 22 weeks gestation of pregnancy: Secondary | ICD-10-CM | POA: Insufficient documentation

## 2021-12-06 DIAGNOSIS — O99352 Diseases of the nervous system complicating pregnancy, second trimester: Secondary | ICD-10-CM | POA: Insufficient documentation

## 2021-12-06 DIAGNOSIS — O99282 Endocrine, nutritional and metabolic diseases complicating pregnancy, second trimester: Secondary | ICD-10-CM | POA: Insufficient documentation

## 2021-12-06 DIAGNOSIS — Z3480 Encounter for supervision of other normal pregnancy, unspecified trimester: Secondary | ICD-10-CM | POA: Insufficient documentation

## 2021-12-06 DIAGNOSIS — Z363 Encounter for antenatal screening for malformations: Secondary | ICD-10-CM | POA: Diagnosis not present

## 2021-12-06 DIAGNOSIS — G40909 Epilepsy, unspecified, not intractable, without status epilepticus: Secondary | ICD-10-CM | POA: Insufficient documentation

## 2021-12-13 ENCOUNTER — Other Ambulatory Visit: Payer: Self-pay | Admitting: Internal Medicine

## 2021-12-14 ENCOUNTER — Ambulatory Visit: Payer: Medicaid Other | Admitting: Neurology

## 2021-12-21 ENCOUNTER — Ambulatory Visit (INDEPENDENT_AMBULATORY_CARE_PROVIDER_SITE_OTHER): Payer: Medicaid Other | Admitting: Obstetrics and Gynecology

## 2021-12-21 VITALS — BP 109/69 | HR 91 | Wt 162.6 lb

## 2021-12-21 DIAGNOSIS — R87612 Low grade squamous intraepithelial lesion on cytologic smear of cervix (LGSIL): Secondary | ICD-10-CM

## 2021-12-21 DIAGNOSIS — R569 Unspecified convulsions: Secondary | ICD-10-CM

## 2021-12-21 DIAGNOSIS — Z3480 Encounter for supervision of other normal pregnancy, unspecified trimester: Secondary | ICD-10-CM

## 2021-12-21 DIAGNOSIS — E0590 Thyrotoxicosis, unspecified without thyrotoxic crisis or storm: Secondary | ICD-10-CM

## 2021-12-21 DIAGNOSIS — Z3A19 19 weeks gestation of pregnancy: Secondary | ICD-10-CM

## 2021-12-21 DIAGNOSIS — O99282 Endocrine, nutritional and metabolic diseases complicating pregnancy, second trimester: Secondary | ICD-10-CM

## 2021-12-21 NOTE — Progress Notes (Signed)
Pt reports fetal movement, denies pain.  

## 2021-12-21 NOTE — Progress Notes (Signed)
? ?  PRENATAL VISIT NOTE ? ?Subjective:  ?Megan Cruz is a 21 y.o. G1P0000 at [redacted]w[redacted]d being seen today for ongoing prenatal care.  She is currently monitored for the following issues for this high-risk pregnancy and has Seizure-like activity ([place]); Parasomnia; Hyperthyroidism; Supervision of other normal pregnancy, antepartum; LGSIL on Pap smear of cervix; and Graves disease on their problem list. ? ?Patient doing well with no acute concerns today. She reports no complaints.  Contractions: Not present. Vag. Bleeding: None.  Movement: Present. Denies leaking of fluid.  ? ?The following portions of the patient's history were reviewed and updated as appropriate: allergies, current medications, past family history, past medical history, past social history, past surgical history and problem list. Problem list updated. ? ?Objective:  ? ?Vitals:  ? 12/21/21 0939  ?BP: 109/69  ?Pulse: 91  ?Weight: 73.8 kg  ? ? ?Fetal Status: Fetal Heart Rate (bpm): 153 Fundal Height: 19 cm Movement: Present    ? ?General:  Alert, oriented and cooperative. Patient is in no acute distress.  ?Skin: Skin is warm and dry. No rash noted.   ?Cardiovascular: Normal heart rate noted  ?Respiratory: Normal respiratory effort, no problems with respiration noted  ?Abdomen: Soft, gravid, appropriate for gestational age.  Pain/Pressure: Absent     ?Pelvic: Cervical exam deferred        ?Extremities: Normal range of motion.  Edema: None  ?Mental Status:  Normal mood and affect. Normal behavior. Normal judgment and thought content.  ? ?Assessment and Plan:  ?Pregnancy: G1P0000 at [redacted]w[redacted]d ? ?1. Hyperthyroidism ?Pt notes compliance with methimazole ? ?2. Supervision of other normal pregnancy, antepartum ?Continue routine prenatal care ? ?3. Seizure-like activity ([place]) ?Pt continues with keppra ? ?4. LGSIL on Pap smear of cervix ?Advise colposcopy at postpartum visit ? ?Preterm labor symptoms and general obstetric precautions including but not limited to  vaginal bleeding, contractions, leaking of fluid and fetal movement were reviewed in detail with the patient. ? ?Please refer to After Visit Summary for other counseling recommendations.  ? ?Return in about 4 weeks (around 01/18/2022) for in person, [facility]. ? ? ?Lynnda Shields, MD ?Faculty Attending ?[facility] [place] ?  ?

## 2022-01-03 ENCOUNTER — Ambulatory Visit: Payer: Medicaid Other

## 2022-01-03 ENCOUNTER — Ambulatory Visit: Payer: Medicaid Other | Admitting: Internal Medicine

## 2022-01-03 NOTE — Progress Notes (Deleted)
Name: Megan Cruz  MRN/ DOB: [id number], 2001-02-07    Age/ Sex: 21 y.o., female    PCP: Angeline Slim, MD   Reason for Endocrinology Evaluation: Hyperthyroidism     Date of Initial Endocrinology Evaluation: 01/03/2022     HPI: Megan Cruz is a 20 y.o. female with a past medical history of hyperthyrodism. The patient presented for initial endocrinology clinic visit on 01/03/2022 for consultative assistance with her Hyperthyroidism.   She was diagnosed with hyperthyroidism during evaluation of tachycardia on 07/21/2021 with a suppressed TSH <0.01 uIU/mL and elevated FT4 4.02 ng/dL and T3 at 425 ng/dL . She was started on Methimazole at the time.    Prior to starting methimazole she  did not notice any weight loss, local neck symptoms but noted hand tremors . No palpitations except the day she presented to the ED   On her initial visit to  our clinic she was on Methimazole but we switched to PTU shortly after pregnancy diagnosis in 07/2021  TRAB elevated at 6.37 IU/L Sister with hypothyroidism   SUBJECTIVE:    Today (01/03/22):  Megan Cruz is here for a follow up for hyperthyroidism during pregnancy.   She is currently 21.2 weeks of gestation  EDD 04/03/2022- baby girl  Follows with women's Physician    Has been noted with weight gain  She has nausea , vomiting is improving  Denies fever or sore throat Denies palpitations  Denies tremors  Denies local neck swelling      Methimazole 5 mg, 4 tabs daily   HISTORY:  Past Medical History:  Past Medical History:  Diagnosis Date   Epilepsia [facility])    Hyperthyroidism    Past Surgical History:  Past Surgical History:  Procedure Laterality Date   APPENDECTOMY     CYST REMOVAL PEDIATRIC Right 07/22/2013   Procedure: CYST REMOVAL PEDIATRIC;  Surgeon: Jerilynn Mages. Gerald Stabs, MD;  Location: [facility];  Service: Pediatrics;  Laterality: Right;   LAPAROSCOPIC APPENDECTOMY N/A 07/22/2013   Procedure: APPENDECTOMY  LAPAROSCOPIC;  Surgeon: Jerilynn Mages. Gerald Stabs, MD;  Location: [facility];  Service: Pediatrics;  Laterality: N/A;    Social History:  reports that she has never smoked. She has been exposed to tobacco smoke. She has never used smokeless tobacco. She reports that she does not drink alcohol and does not use drugs. Family History: family history includes Migraines in her sister.   HOME MEDICATIONS: Allergies as of 01/03/2022   No Known Allergies      Medication List        Accurate as of Jan 03, 2022  7:21 AM. If you have any questions, ask your nurse or doctor.          Blood Pressure Monitor Kit Use blood pressure monitor to check BP weekly   Gojji Weight Scale Misc Use scale to check weight once weekly   levETIRAcetam 500 MG 24 hr tablet Commonly known as: KEPPRA XR Take 2 tablets twice a day   methimazole 5 MG tablet Commonly known as: TAPAZOLE Take 2 tablets (10 mg total) by mouth 2 (two) times daily.   prenatal multivitamin Tabs tablet Take 1 tablet by mouth daily at 12 noon.          REVIEW OF SYSTEMS: A comprehensive ROS was conducted with the patient and is negative except as per HPI     OBJECTIVE:  VS: LMP 06/27/2021 (Within Months)    Wt Readings from Last 3 Encounters:  12/21/21  162 lb 9.6 oz (73.8 kg)  11/23/21 158 lb 12.8 oz (72 kg)  11/02/21 157 lb (71.2 kg)     EXAM: General: Pt appears well and is in NAD  Hydration: Well-hydrated with moist mucous membranes and good skin turgor  Eyes: External eye exam normal without stare, lid lag or exophthalmos.  EOM intact.  PERRL.  Neck: General: Supple without adenopathy. Thyroid: Thyroid size normal.  No goiter or nodules appreciated  Lungs: Clear with good BS bilat with no rales, rhonchi, or wheezes  Heart: Auscultation: Tachycardic  Abdomen: Normoactive bowel sounds, soft, nontender, without masses or organomegaly palpable  Extremities:  BL LE: No pretibial edema normal ROM and strength.  Mental  Status: Judgment, insight: Intact Orientation: Oriented to time, place, and person Mood and affect: No depression, anxiety, or agitation     DATA REVIEWED:  Latest Reference Range & Units 11/02/21 09:56  Sodium 135 - 145 mEq/L 136  Potassium 3.5 - 5.1 mEq/L 4.0  Chloride 96 - 112 mEq/L 106  CO2 19 - 32 mEq/L 22  Glucose 70 - 99 mg/dL 89  BUN 6 - 23 mg/dL 5 (L)  Creatinine 0.40 - 1.20 mg/dL 0.44  Calcium 8.4 - 10.5 mg/dL 9.4  Alkaline Phosphatase 39 - 117 U/L 68  Albumin 3.5 - 5.2 g/dL 3.5  AST 0 - 37 U/L 11  ALT 0 - 35 U/L 9  Total Protein 6.0 - 8.3 g/dL 5.8 (L)  Total Bilirubin 0.2 - 1.2 mg/dL 0.3  GFR >60.00 mL/min 138.27  WBC 4.0 - 10.5 K/uL 9.4  RBC 3.87 - 5.11 Mil/uL 4.40  Hemoglobin 12.0 - 15.0 g/dL 12.4  HCT 36.0 - 46.0 % 36.7  MCV 78.0 - 100.0 fl 83.4  MCHC 30.0 - 36.0 g/dL 33.8  RDW 11.5 - 15.5 % 14.7  Platelets 150.0 - 400.0 K/uL 310.0  Neutrophils 43.0 - 77.0 % 67.1  Lymphocytes 12.0 - 46.0 % 23.4  Monocytes Relative 3.0 - 12.0 % 6.5  Eosinophil 0.0 - 5.0 % 2.8  Basophil 0.0 - 3.0 % 0.2  NEUT# 1.4 - 7.7 K/uL 6.3  Lymphocyte # 0.7 - 4.0 K/uL 2.2  Monocyte # 0.1 - 1.0 K/uL 0.6  Eosinophils Absolute 0.0 - 0.7 K/uL 0.3  Basophils Absolute 0.0 - 0.1 K/uL 0.0     Latest Reference Range & Units 11/02/21 09:56  TSH 0.35 - 5.50 uIU/mL <0.01 (L)  Thyroxine (T4) 5.1 - 11.9 mcg/dL 18.1 (H)     Latest Reference Range & Units Most Recent  TRAB <=2.00 IU/L 6.37 (H) 07/27/21 10:44    ASSESSMENT/PLAN/RECOMMENDATIONS:   Hyperthyroidism:  - Pt is clinically hyperthyroid - NO local neck symptoms  -The goal of treatment is to maintain persistent but mild hyperthyroidism in the mother in an attempt to prevent fetal hypothyroidism since the fetal thyroid is more sensitive to the action of thionamide therapy. Overtreatment of maternal hyperthyroidism can cause fetal goiter and primary hypothyroidism.    Medications :  Methimazole 5 mg, 4 tabs daily   2. Graves'  Disease:   - No extra thyroidal manifestations of Graves' disease   F/U in 2 months  Labs in 2 weeks   Signed electronically by: Mack Guise, MD  [facility]  [facility] [address] [place], [place] [address] Phone: [phone] FAX: [phone]   CC: Angeline Slim, MD [address] [address] Phone: [phone] Fax: [phone]   Return to Endocrinology clinic as below: Future  Appointments  Date Time Provider [place]  01/03/2022  9:50 AM Roshawna Colclasure, Melanie Crazier, MD [facility] None  01/07/2022 11:30 AM Cameron Sprang, MD [facility] None  01/18/2022 10:35 AM Donnamae Jude, MD [facility] None  01/24/2022  1:45 PM [facility] NURSE [facility]  01/24/2022  2:00 PM [facility] US1 [facility]

## 2022-01-07 ENCOUNTER — Encounter: Payer: Self-pay | Admitting: Neurology

## 2022-01-07 ENCOUNTER — Other Ambulatory Visit (INDEPENDENT_AMBULATORY_CARE_PROVIDER_SITE_OTHER): Payer: Medicaid Other

## 2022-01-07 ENCOUNTER — Ambulatory Visit: Payer: Medicaid Other | Admitting: Neurology

## 2022-01-07 VITALS — BP 110/72 | HR 88 | Ht 60.0 in | Wt 166.0 lb

## 2022-01-07 DIAGNOSIS — G40B09 Juvenile myoclonic epilepsy, not intractable, without status epilepticus: Secondary | ICD-10-CM

## 2022-01-07 NOTE — Progress Notes (Signed)
NEUROLOGY FOLLOW UP OFFICE NOTE  Megan Cruz [id number] 07-May-2001  HISTORY OF PRESENT ILLNESS: I had the pleasure of seeing Megan Cruz in follow-up in the neurology clinic on 01/07/2022.  The patient was last seen 3 months ago for seizures. She is alone in the office today. Records and images were personally reviewed where available.  On her last visit, she reported finding out she was pregnant. She is [redacted] weeks pregnant and doing well. She denies any seizures since 09/09/2021. No staring/unresponsive episodes, gaps in time, olfactory/gustatory hallucinations, focal numbness/tingling/weakness, myoclonic jerks. No headaches, dizziness, vision changes, no falls. Keppra level in 09/2021 on 500/1000mg dose was 6.9. She was instructed to increase Levetiracetam ER to 10103m BID which she is tolerating without side effects. She continues to work at [employer] Sleep and mood are good. She does not drive.   History on Initial Assessment 08/02/2021: This is a 21year old right-handed woman with a history of newly diagnosed hyperthyroidism presenting for evaluation of seizures. She was evaluated by pediatric neurologist Dr. NJordan Hawksin 01/2018 after an episode of eye rolling, confusion, and brief jerking when her mother woke her up in the middle of the night to take care of a baby. She remembered jerking and was able to go back to sleep. She noted occasional brief jerking in her arms when she wakes up in the morning. EEG at that time was normal. She did well for several years until 07/20/21 when she was brought to the ER for seizure-like activity. There was a family argument and she recalled going to her bedroom where her mother found her having generalized shaking for 1-2 minutes. She denies any prior warning symptoms. She woke up in her bed and found she bit her tongue and both legs were weak. She admitted to taking a THC gummy prior to the event, which she has had in the past with no issues. She was  tachycardic in the 160s-170s, given IV fluids.  Bloodwork showed normal CBC, BMP. Her TSH was very low at <0.010, free T4 was 4.02, T3 425, she was started on methimazole. She was seen by Endocrinology and started on atenolol. She lives with her mother. She denies any staring/unresponsive episodes, gaps in time, olfactory/gustatory hallucinations, deja vu, rising epigastric sensation, focal numbness/tingling/weakness. She denies any headaches, dizziness, diplopia, dysarthria/dysphagia, neck/back pain, bowel/bladder dysfunction. Sleep is good until recently with her thyroid medications, she wakes up at 2am but goes back to sleep. She works at [employer] Her brother had a single seizure. Otherwise she had a normal birth and early development.  There is no history of febrile convulsions, CNS infections such as meningitis/encephalitis, significant traumatic brain injury, neurosurgical procedures.  Diagnostic Data: MRI with and without contrast done 08/2021 did not show any acute changes, hippocampi symmetric with no abnormal signal or enhancement seen. There was note of very small foci of increased T2 FLAIR signal in the bilateral cerebral white matter. There was prominence of palatine tonsils and lymph nodes in the upper neck.   1-hour EEG in 07/2021 was abnormal due to occasional generalized high voltage 4-5 Hz spike and polyspike and wave discharges lasting 1-3 seconds consistent with a primary generalized epilepsy.    PAST MEDICAL HISTORY: Past Medical History:  Diagnosis Date   Epilepsia ([facility]    Hyperthyroidism     MEDICATIONS: Current Outpatient Medications on File Prior to Visit  Medication Sig Dispense Refill   Blood Pressure Monitor KIT Use blood pressure monitor to check BP weekly 1  kit 0   levETIRAcetam (KEPPRA XR) 500 MG 24 hr tablet Take 2 tablets twice a day 120 tablet 6   methimazole (TAPAZOLE) 5 MG tablet Take 2 tablets (10 mg total) by mouth 2 (two) times daily. 120 tablet 4   Misc.  Devices (GOJJI WEIGHT SCALE) MISC Use scale to check weight once weekly 1 each 0   Prenatal Vit-Fe Fumarate-FA (PRENATAL MULTIVITAMIN) TABS tablet Take 1 tablet by mouth daily at 12 noon.     No current facility-administered medications on file prior to visit.    ALLERGIES: No Known Allergies  FAMILY HISTORY: Family History  Problem Relation Age of Onset   Migraines Sister    Seizures Neg Hx    Autism Neg Hx    ADD / ADHD Neg Hx    Anxiety disorder Neg Hx    Depression Neg Hx    Bipolar disorder Neg Hx    Schizophrenia Neg Hx     SOCIAL HISTORY: Social History   Socioeconomic History   Marital status: Single    Spouse name: Not on file   Number of children: Not on file   Years of education: Not on file   Highest education level: Not on file  Occupational History   Not on file  Tobacco Use   Smoking status: Never    Passive exposure: Yes   Smokeless tobacco: Never  Vaping Use   Vaping Use: Never used  Substance and Sexual Activity   Alcohol use: No   Drug use: No   Sexual activity: Yes    Partners: Male    Birth control/protection: None  Other Topics Concern   Not on file  Social History Narrative   Patient lives with mom and brother. She has graduated and is not attending college at the moment.    Right handed        Social Determinants of Health   Financial Resource Strain: Not on file  Food Insecurity: Not on file  Transportation Needs: Not on file  Physical Activity: Not on file  Stress: Not on file  Social Connections: Not on file  Intimate Partner Violence: Not on file     PHYSICAL EXAM: Vitals:   01/07/22 1118  BP: 110/72  Pulse: 88  SpO2: 99%   General: No acute distress Head:  Normocephalic/atraumatic Skin/Extremities: No rash, no edema Neurological Exam: alert and awake. No aphasia or dysarthria. Fund of knowledge is appropriate. Attention and concentration are normal.   Cranial nerves: Pupils equal, round. Extraocular movements  intact with no nystagmus. Visual fields full.  No facial asymmetry.  Motor: Bulk and tone normal, muscle strength 5/5 throughout with no pronator drift.   Finger to nose testing intact.  Gait narrow-based and steady, able to tandem walk adequately.  Romberg negative.   IMPRESSION: This is a pleasant 21 yo RH G1P0 woman with a history of hyperthyroidism, with Juvenile Myoclonic Epilepsy. EEG showed generalized 4-5 Hz spike and polyspike and wave discharges. MRI brain unremarkable. She is due for delivery in August. Her last nocturnal seizure was 09/09/2021. We discussed recommendation to check Keppra levels every month until delivery, check Keppra level today. She is on Levetiracetam ER 1027m BID, we will call with level results and further recommendations. Continue prenatal vitamins with folic acid. She does not drive. Follow-up in 5 months, call for any changes.    Thank you for allowing me to participate in her care.  Please do not hesitate to call for any questions or concerns.  Ellouise Newer, M.D.   CC: Dr. Vilma Prader, Dr. Rip Harbour

## 2022-01-07 NOTE — Patient Instructions (Signed)
Good to see you doing well.   Check Keppra level. We will continue to check monthly until delivery.  2. Follow-up in 5 months, call for any changes   Wishing you all the best! Call for any changes   Seizure Precautions: 1. If medication has been prescribed for you to prevent seizures, take it exactly as directed.  Do not stop taking the medicine without talking to your doctor first, even if you have not had a seizure in a long time.   2. Avoid activities in which a seizure would cause danger to yourself or to others.  Don't operate dangerous machinery, swim alone, or climb in high or dangerous places, such as on ladders, roofs, or girders.  Do not drive unless your doctor says you may.  3. If you have any warning that you may have a seizure, lay down in a safe place where you can't hurt yourself.    4.  No driving for 6 months from last seizure, as per [facility].   Please refer to the following link on the [place] website for more information: http://www.epilepsyfoundation.org/answerplace/Social/driving/drivingu.cfm   5.  Maintain good sleep hygiene. Avoid alcohol.  6.  Notify your neurology if you are planning pregnancy or if you become pregnant.  7.  Contact your doctor if you have any problems that may be related to the medicine you are taking.  8.  Call 911 and bring the patient back to the ED if:        A.  The seizure lasts longer than 5 minutes.       B.  The patient doesn't awaken shortly after the seizure  C.  The patient has new problems such as difficulty seeing, speaking or moving  D.  The patient was injured during the seizure  E.  The patient has a temperature over 102 F (39C)  F.  The patient vomited and now is having trouble breathing

## 2022-01-12 LAB — LEVETIRACETAM LEVEL: Keppra (Levetiracetam): 2.8 ug/mL — ABNORMAL LOW

## 2022-01-14 ENCOUNTER — Telehealth: Payer: Self-pay

## 2022-01-14 DIAGNOSIS — Z3490 Encounter for supervision of normal pregnancy, unspecified, unspecified trimester: Secondary | ICD-10-CM

## 2022-01-14 DIAGNOSIS — G40B09 Juvenile myoclonic epilepsy, not intractable, without status epilepticus: Secondary | ICD-10-CM

## 2022-01-14 MED ORDER — LEVETIRACETAM ER 500 MG PO TB24
ORAL_TABLET | ORAL | 6 refills | Status: DC
Start: 2022-01-14 — End: 2023-04-06

## 2022-01-14 NOTE — Telephone Encounter (Signed)
-----   Message from Van Clines, MD sent at 01/13/2022  9:57 PM EDT ----- Pls let her know Keppra level has dropped. Pls increase Keppra XR 500mg : Take 3 tablets twice a day. Will need to recheck level in a week. Pls send updated Rx for increased dose. Thanks

## 2022-01-18 ENCOUNTER — Encounter: Payer: Medicaid Other | Admitting: Obstetrics

## 2022-01-18 ENCOUNTER — Ambulatory Visit (INDEPENDENT_AMBULATORY_CARE_PROVIDER_SITE_OTHER): Payer: Medicaid Other | Admitting: Student

## 2022-01-18 VITALS — BP 103/67 | HR 95 | Wt 164.0 lb

## 2022-01-18 DIAGNOSIS — Z3A23 23 weeks gestation of pregnancy: Secondary | ICD-10-CM

## 2022-01-18 DIAGNOSIS — Z3480 Encounter for supervision of other normal pregnancy, unspecified trimester: Secondary | ICD-10-CM

## 2022-01-18 NOTE — Patient Instructions (Signed)
AREA PEDIATRIC/FAMILY PRACTICE PHYSICIANS  Central/[place]outheast [place] ([address])  [facility] Chambliss, MD; Eniola, MD; Hale, MD; Hensel, MD; McDiarmid, MD; McIntyer, MD; Neal, MD; Walden, MD [address] [place], [place] [address] [phone] Mon-Fri 8:30-12:30, 1:30-5:00 Providers come to see babies at [facility] Accepting Medicaid [facility] providers who accept newborns: Koirala, MD; Morrow, MD; Wolters, MD [address], [place], [place] [address] [phone] Mon-Fri 8:00-5:30 Babies seen by providers at [facility] Does NOT accept Medicaid Please call early in hospitalization for appointment (limited availability)  [facility] Mulberry, MD [address] [place], [place] [address] [phone] Mon, Tue, Thur, Fri 8:30-5:00, Wed 10:00-7:00 (closed 1-2pm) Babies seen by [facility] providers Accepting Medicaid Rubin - Pediatrician Rubin, MD [address], [place], [place] [address] [phone] Mon-Fri 8:30-5:00, Sat 8:30-12:00 Provider comes to see babies at [facility] Accepting Medicaid Must have been referred from current patients or contacted office prior to delivery [facility] ([facility]) Brown, MD; Chandler, MD; Ettefagh, MD; Grant, MD; Lester, MD; McCormick, MD; McQueen, MD; Prose, MD; Simha, MD; Stanley, MD; Stryffeler, NP; Tebben, NP [address], [place], [place] [address] [phone] Mon, Tue, Thur, Fri 8:30-5:30, Wed 9:30-5:30, Sat 8:30-12:30 Babies seen by [facility] providers Accepting Medicaid Only accepting infants of first-time parents or siblings of current patients Hospital discharge coordinator will make follow-up appointment Jack Amos [address], [place], [place]  [address] [phone]   Fax - [phone] [facility] [address], [place], [place]  [address] Phone - [phone]   Fax - [phone] Shilpa Gosrani [address], [place], [place]  [address] [phone]  East/Northeast [place] ([address]) [facility] Bates, MD; Brassfield, MD; Cooper, Cox, MD; MD; Davis, MD; Dovico, MD; Ettefaugh, MD; Little, MD; Lowe, MD; Keiffer, MD; Melvin, MD; Sumner, MD; Williams, MD [address], [place], [place] [address] [phone] Mon-Fri 8:30-5:00 (extended evenings Mon-Thur as needed), Sat-Sun 10:00-1:00 Providers come to see babies at [facility] Accepting Medicaid for families of first-time babies and families with all children in the household age 3 and under. Must register with office prior to making appointment (M-F only). [facility] Henson, NP; Knapp, MD; Lalonde, MD; Tysinger, PA [address] [place], [place] [address] [phone] Mon-Fri 8:00-5:00 Babies seen by providers at [facility] Does NOT accept Medicaid/Commercial Insurance Only [facility] ([facility])  Artis, MD; Barnes, MD; Bratton, MD; Coccaro, MD; [place] Gardner, MD; Kramer, MD; Marshall, MD; Netherton, MD; Poleto, MD; Skinner, MD [address] [place], [place] [address] [phone] Mon-Fri 8:30-5:30, Sat (Oct.-Mar.) 9:00-1:00 Babies seen by providers at [facility] Accepting Medicaid  [place] ([address]) [facility] Reid, MD; Warner, MD [address], [place], [place] [address] [phone] Mon-Fri 8:30-5:00, Sat 8:30-12:00 Providers come to see babies at [facility] Does NOT accept Medicaid [facility] Becker, PA; Hagler, MD; Scifres, PA; Sun, MD; Swayne, MD [address], [place],  [address] [phone] Mon-Fri 8:00-5:00 Babies seen by providers at [facility] Does NOT accept Medicaid Only accepting babies of parents who  are patients Please call early in hospitalization for appointment (limited availability) [facility] Clark, MD; Frye, MD; Kelleher, MD; Mack, NP; Miller, MD; O'Keller, MD; [place], NP; Pudlo, MD; Puzio, MD; Thomas, MD; Tucker, MD; Twiselton, MD [address], [place], [place] [address] [phone] Mon-Fri 8:00-5:00, Sat 9:00-12:00 Providers come to see babies at [facility] Does NOT accept Medicaid  [place] ([address]) [facility] providers accepting new patients: Brake, NP; Wharton, PA [address], [place], [place] [address] [phone] Mon-Fri 8:00-5:00 Babies seen by providers at [facility] Does NOT accept Medicaid Only accepting babies of parents who are patients Please call early in hospitalization for appointment (limited availability) [facility] Gay, MD; Quinlan, MD [address] [place], [place] [address] [phone] (press 1 to schedule appointment) Mon-Fri 8:00-5:00 Providers come to see babies at [facility] Does NOT accept Medicaid [facility] Mazer, MD [address] [place], [place] [address] [phone] Mon-Fri 8:30-5:00 (lunch 12:30-1:00), extended hours by appointment only Wed 5:00-6:30 Babies seen by [facility] providers Accepting Medicaid [facility] Banks, MD; Jordan, MD; Koberlein, MD [address], [place], [place] [address] [phone] Mon-Fri 8:00-5:00 Babies seen by [facility] providers Does NOT accept Medicaid [facility] Parker, MD; Hunter, MD; Wallace, DO [address] [place], [place] [address] [phone] Mon-Fri 8:00-5:00 Babies seen by [facility] providers Does NOT accept Medicaid [facility] Brandon, PA; Brecken, PA; Christy, NP; Dees, MD; DeClaire, MD; DeWeese, MD; Hansen, NP; Mills, NP; Parrish, NP; Smoot, NP; Summer, MD; Vapne,  MD [address] [place], [place] [address] [phone] Mon-Fri 8:30-5:00, Sat 10:00-1:00 Providers come to see babies at [facility] Does NOT accept Medicaid Free prenatal information session Tuesdays at 4:45pm [facility] Bouska, MD; Gordon, PA; Jeffery, PA; Weber, PA [address] [place] [address] [phone] Mon-Fri 7:30-5:30 Babies seen by [facility] providers [facility] [address], [place], [place]  [address] [phone]   Fax - [phone]  [place] ([address] & [address]) [facility] [place] [address] [place], [place] [address] [phone] Mon-Thur 8:00-6:00 Providers come to see babies at [facility] Accepting Medicaid [facility] Anderson, NP; Badger, MD; Beal, PA; Spencer, PA [address] [place], [place] [address] [phone] Mon-Thur 7:30-7:30, Fri 7:30-4:30 Babies seen by [facility] providers Accepting Medicaid [facility] Agbuya, MD; Klett, NP; Romgoolam, MD [address], [place], [place] [address] [phone] Mon-Fri 8:30-5:00, Sat 8:30-12:00 Providers come to see babies at [facility] Accepting Medicaid Must have "Meet & Greet" appointment at office prior to delivery [facility] ([facility]) McCord, MD; Wallace, MD; Wood, MD [address], [place], [place] [address] [phone] Mon-Wed 8:00-6:00, Thur-Fri 8:00-5:00, Sat 9:00-12:00 Providers come to see babies at [facility] Does NOT accept Medicaid Only accepting siblings of current patients [facility]  [address], [place], [place]  [address] [phone]   Fax - [phone] [facility] [address], [place], [place]  [address] [phone]   Fax -  [phone]  [place] ([address] & [address]) [facility] Cirigliano, DO; Matthews, DO [address] [place], [place] [address] [phone] Mon-Fri 7:00-5:00 Babies seen by [facility] providers Does NOT accept Medicaid [facility] Briscoe, MD; Howley, PA; Moreira, PA [address], [place], [place] [address] [phone] Mon-Fri 8:00-5:00 Babies seen by [facility] providers Accepting Medicaid [facility] Boyd, MD; Church, PA; Jones, NP; Osborn, PA [address], ,  [address] [phone] Mon-Fri 8:00-5:00 Babies seen by providers at [facility] Accepting Medicaid  [place] ([address]) [facility] [place] Wendling, DO [address] [place], [place] [address] [phone] Mon-Fri 8:00-5:00 Babies seen by [facility] providers Does NOT accept Medicaid Limited availability, please call early in hospitalization to schedule follow-up [facility] Calderon, PA; Cummings, MD; Dillard, MD; Martin, PA; Olson, MD; VanDeven, PA [address], [place], [place] [address] [phone] Mon-Fri 8:30-5:00, Sat 9:00-12:00 Babies seen by providers at [facility] Accepting Medicaid Please register online then schedule online or call office [email or web address] ([facility]) Hunter, NP; Kumar, MD; Martin Rogers, PA [address], [place], [place] [address] [phone] Mon-Fri 8:00-5:00 Babies seen by providers at [facility] Accepting Medicaid [facility] ([facility]) [place], MD; Kristi Fleenor, NP; West, MD [address], [place], [place] [address] [phone] Mon-Fri 8:00-5:30, Sat&Sun by appointment (phones open at  8:30) Babies seen by [facility] providers Accepting Medicaid Must be a first-time baby or sibling of current patient [facility] [place]  [address], [place], [place]  [address] [phone]   Fax - [phone]  [place] ([address]) [facility] Brown, PA; Cowen, PA; Rice, MD; Helton, PA; Spry, MD [address] [place], [place] [address] [phone] Mon-Thur 8:00-7:00, Fri 8:00-5:00, Sat 8:00-12:00, Sun 9:00-12:00 Babies seen by [facility] providers Accepting Medicaid [facility] [place] Coe-Goins, MD; Marshall, MD; Pierre-Louis, MD [address] [place] [address], [place], [place] [address] [phone] Mon-Thur 8:00-5:00 Babies seen by providers at [facility] Accepting Medicaid [facility] Bratton, MD; Coe-Goins, MD; Hayes, MD; Lewis, MD; List, MD; Lott, MD; Marshall, MD; Moran, MD; O'Neal, MD; Pierre-Louis, MD; Pitonzo, MD; Scholer, MD; Spangle, MD [address] [place], [place] [address] [phone] Mon-Fri 8:00-5:30, Sat (Oct.-Mar.) 9:00-1:00 Babies seen by providers at [facility] Accepting Medicaid Must fill out new patient packet, available online at [email or web address] [facility] ([facility]) Friddle, NP; Harris, NP; Kelly, NP; Logan, MD; Melvin, PA; Poth, MD; Ramadoss, MD; Stanton, NP [address], [place], [place] [address] [phone] Mon-Thur 8:00-5:30, Fri 8:00-5:00 Babies seen by providers at [facility] Accepting Medicaid  [place] ([address]) [facility] Dixon, PA; [place], MD; Pickard, MD; Tapia, PA [address], [place], [place] [address] [phone] Mon-Fri 8:00-5:00 Babies seen by providers at [facility] Accepting Medicaid   [place] ([address]) [facility] [place] Masneri, DO; Meyers, MD; Nelson, PA [address], [place], [place] [address] [phone] Mon-Fri 8:00-5:00 Babies seen by providers at [facility] Does NOT accept Medicaid Limited appointment availability, please call early in hospitalization  [facility] [place] Kunedd, DO; McGowen, MD [address], [place], [place] [address] [phone] Mon-Fri 8:00-5:00 Babies seen by [facility] providers Does NOT accept Medicaid [facility] [place] Cameron, MD; MacDonald, MD; Michaels, PA; Nayak, MD [address] [place] [address], [place], [place] [address] [phone] Mon-Fri 8:00-5:00 After hours clinic ([address] [place], [place] [address]) [phone] Mon-Fri 5:00-8:00, Sat 12:00-6:00, Sun 10:00-4:00 Babies seen by [facility] providers Accepting Medicaid [facility] [place] [address], [place], [place]  [address] [phone]   Fax - [phone]  [place] ([address]) [facility] [place] [facility] Andy, MD [address], [place], [place] [address] [phone] Mon-Fri 8:00-5:00 Babies seen by [facility] providers Does NOT accept Medicaid [facility] [place] ([facility] [place]) Eksir, MD [address], [place], [place] [address] [phone] Mon-Thur 8:00-7:00, Fri 8:00-5:00, Sat 8:00-12:00 Babies seen by providers at [facility] Accepting Medicaid - but does not have vaccinations in office (must be received elsewhere) Limited availability, please call early in hospitalization  [place] ([address]) [facility]  Charlene Flemming, MD [address], [place] [address] [phone]  Fax [phone]  [facility]  Kimberly Newton, MD, Annamarie Streilein, PA, Carla Hampton, PA [address] [place] [place], [place]  [address] [phone] [facility]  [address], [place], [place] [address] [phone] [address], [place], [place] [address] [phone] (West Office)  [facility] [address], [place], [place] [address] [phone] [facility] [address], [place], [place] [address] [phone] [facility] [address], [place], [place] [address] [phone] [facility] [address], [place], [place] [address] [phone] [facility] [address], [place], [place] [address] [phone] [facility] [address], [place], [place] [address] [phone] [facility]  [address], [place], [place] [address] [phone] [facility] [address] [place], , [place] [address] [phone] [facility] [address], [place], [place] [address] [phone] [facility] [address], , [place] [address] [phone]  

## 2022-01-18 NOTE — Progress Notes (Signed)
   PRENATAL VISIT NOTE  Subjective:  Megan Cruz is a 21 y.o. G1P0000 at [redacted]w[redacted]d being seen today for ongoing prenatal care.  She is currently monitored for the following issues for this high-risk pregnancy and has Seizure-like activity ([place]); Parasomnia; Hyperthyroidism; Supervision of other normal pregnancy, antepartum; LGSIL on Pap smear of cervix; and Graves disease on their problem list.  Patient reports no complaints.  Contractions: Not present. Vag. Bleeding: None.  Movement: Present. Denies leaking of fluid.   The following portions of the patient's history were reviewed and updated as appropriate: allergies, current medications, past family history, past medical history, past social history, past surgical history and problem list.   Objective:   Vitals:   01/18/22 1314  BP: 103/67  Pulse: 95  Weight: 164 lb (74.4 kg)    Fetal Status: Fetal Heart Rate (bpm): 154 Fundal Height: 22 cm Movement: Present     General:  Alert, oriented and cooperative. Patient is in no acute distress.  Skin: Skin is warm and dry. No rash noted.   Cardiovascular: Normal heart rate noted  Respiratory: Normal respiratory effort, no problems with respiration noted  Abdomen: Soft, gravid, appropriate for gestational age.  Pain/Pressure: Present     Pelvic: Cervical exam deferred        Extremities: Normal range of motion.  Edema: None  Mental Status: Normal mood and affect. Normal behavior. Normal judgment and thought content.   Assessment and Plan:  Pregnancy: G1P0000 at [redacted]w[redacted]d 1. Supervision of other normal pregnancy, antepartum -Doing well -anticipatory guidance provided -F/U Growth ultrasound on 01/24/22  2. [redacted] weeks gestation of pregnancy   Preterm labor symptoms and general obstetric precautions including but not limited to vaginal bleeding, contractions, leaking of fluid and fetal movement were reviewed in detail with the patient. Please refer to After Visit Summary for other  counseling recommendations.   Return in about 4 weeks (around 02/15/2022) for LOB/GTT.  Future Appointments  Date Time Provider [place]  01/24/2022  1:45 PM [facility] NURSE [facility]  01/24/2022  2:00 PM [facility] US1 [facility]  02/15/2022  8:30 AM [facility] LAB [facility] None  02/15/2022  9:35 AM Shelly Bombard, MD Regino Ramirez None  06/08/2022 11:30 AM [place] Sprang, MD [facility] None    Johnston Ebbs, NP

## 2022-01-24 ENCOUNTER — Other Ambulatory Visit: Payer: Self-pay | Admitting: *Deleted

## 2022-01-24 ENCOUNTER — Ambulatory Visit (HOSPITAL_BASED_OUTPATIENT_CLINIC_OR_DEPARTMENT_OTHER): Payer: Medicaid Other

## 2022-01-24 ENCOUNTER — Ambulatory Visit: Payer: Medicaid Other | Attending: Obstetrics | Admitting: *Deleted

## 2022-01-24 VITALS — BP 110/60 | HR 82

## 2022-01-24 DIAGNOSIS — Z79899 Other long term (current) drug therapy: Secondary | ICD-10-CM | POA: Diagnosis not present

## 2022-01-24 DIAGNOSIS — Z3480 Encounter for supervision of other normal pregnancy, unspecified trimester: Secondary | ICD-10-CM

## 2022-01-24 DIAGNOSIS — G40909 Epilepsy, unspecified, not intractable, without status epilepticus: Secondary | ICD-10-CM

## 2022-01-24 DIAGNOSIS — E0590 Thyrotoxicosis, unspecified without thyrotoxic crisis or storm: Secondary | ICD-10-CM | POA: Diagnosis not present

## 2022-01-24 DIAGNOSIS — O99282 Endocrine, nutritional and metabolic diseases complicating pregnancy, second trimester: Secondary | ICD-10-CM | POA: Diagnosis present

## 2022-01-24 DIAGNOSIS — O99352 Diseases of the nervous system complicating pregnancy, second trimester: Secondary | ICD-10-CM | POA: Insufficient documentation

## 2022-01-24 DIAGNOSIS — Z3A29 29 weeks gestation of pregnancy: Secondary | ICD-10-CM | POA: Diagnosis not present

## 2022-01-24 DIAGNOSIS — Z363 Encounter for antenatal screening for malformations: Secondary | ICD-10-CM | POA: Insufficient documentation

## 2022-01-24 DIAGNOSIS — O99283 Endocrine, nutritional and metabolic diseases complicating pregnancy, third trimester: Secondary | ICD-10-CM

## 2022-01-24 DIAGNOSIS — O99353 Diseases of the nervous system complicating pregnancy, third trimester: Secondary | ICD-10-CM

## 2022-01-24 DIAGNOSIS — E0500 Thyrotoxicosis with diffuse goiter without thyrotoxic crisis or storm: Secondary | ICD-10-CM

## 2022-02-07 ENCOUNTER — Other Ambulatory Visit: Payer: Self-pay

## 2022-02-07 DIAGNOSIS — R569 Unspecified convulsions: Secondary | ICD-10-CM

## 2022-02-12 ENCOUNTER — Other Ambulatory Visit: Payer: Self-pay | Admitting: Neurology

## 2022-02-15 ENCOUNTER — Encounter: Payer: Medicaid Other | Admitting: Obstetrics

## 2022-02-15 ENCOUNTER — Ambulatory Visit (INDEPENDENT_AMBULATORY_CARE_PROVIDER_SITE_OTHER): Payer: Medicaid Other | Admitting: Student

## 2022-02-15 ENCOUNTER — Other Ambulatory Visit: Payer: Medicaid Other

## 2022-02-15 VITALS — BP 107/74 | HR 102 | Wt 170.8 lb

## 2022-02-15 DIAGNOSIS — E0590 Thyrotoxicosis, unspecified without thyrotoxic crisis or storm: Secondary | ICD-10-CM

## 2022-02-15 DIAGNOSIS — Z3480 Encounter for supervision of other normal pregnancy, unspecified trimester: Secondary | ICD-10-CM

## 2022-02-15 DIAGNOSIS — Z23 Encounter for immunization: Secondary | ICD-10-CM | POA: Diagnosis not present

## 2022-02-15 DIAGNOSIS — R569 Unspecified convulsions: Secondary | ICD-10-CM

## 2022-02-15 DIAGNOSIS — Z3A32 32 weeks gestation of pregnancy: Secondary | ICD-10-CM

## 2022-02-15 DIAGNOSIS — Z3483 Encounter for supervision of other normal pregnancy, third trimester: Secondary | ICD-10-CM

## 2022-02-16 LAB — GLUCOSE TOLERANCE, 2 HOURS W/ 1HR
Glucose, 1 hour: 128 mg/dL (ref 70–179)
Glucose, 2 hour: 124 mg/dL (ref 70–152)
Glucose, Fasting: 80 mg/dL (ref 70–91)

## 2022-02-16 LAB — HIV ANTIBODY (ROUTINE TESTING W REFLEX): HIV Screen 4th Generation wRfx: NONREACTIVE

## 2022-02-16 LAB — CBC
Hematocrit: 30.1 % — ABNORMAL LOW (ref 34.0–46.6)
Hemoglobin: 9.7 g/dL — ABNORMAL LOW (ref 11.1–15.9)
MCH: 26.6 pg (ref 26.6–33.0)
MCHC: 32.2 g/dL (ref 31.5–35.7)
MCV: 83 fL (ref 79–97)
Platelets: 323 10*3/uL (ref 150–450)
RBC: 3.64 x10E6/uL — ABNORMAL LOW (ref 3.77–5.28)
RDW: 12.9 % (ref 11.7–15.4)
WBC: 12.6 10*3/uL — ABNORMAL HIGH (ref 3.4–10.8)

## 2022-02-16 LAB — RPR: RPR Ser Ql: NONREACTIVE

## 2022-02-21 ENCOUNTER — Other Ambulatory Visit: Payer: Self-pay | Admitting: *Deleted

## 2022-02-21 ENCOUNTER — Ambulatory Visit: Payer: Medicaid Other | Admitting: *Deleted

## 2022-02-21 ENCOUNTER — Encounter: Payer: Self-pay | Admitting: *Deleted

## 2022-02-21 ENCOUNTER — Ambulatory Visit: Payer: Medicaid Other | Attending: Obstetrics and Gynecology

## 2022-02-21 VITALS — BP 102/65 | HR 76

## 2022-02-21 DIAGNOSIS — O99353 Diseases of the nervous system complicating pregnancy, third trimester: Secondary | ICD-10-CM | POA: Insufficient documentation

## 2022-02-21 DIAGNOSIS — E0590 Thyrotoxicosis, unspecified without thyrotoxic crisis or storm: Secondary | ICD-10-CM

## 2022-02-21 DIAGNOSIS — E0500 Thyrotoxicosis with diffuse goiter without thyrotoxic crisis or storm: Secondary | ICD-10-CM | POA: Insufficient documentation

## 2022-02-21 DIAGNOSIS — Z3480 Encounter for supervision of other normal pregnancy, unspecified trimester: Secondary | ICD-10-CM

## 2022-02-21 DIAGNOSIS — Z3A33 33 weeks gestation of pregnancy: Secondary | ICD-10-CM | POA: Diagnosis not present

## 2022-02-21 DIAGNOSIS — G40909 Epilepsy, unspecified, not intractable, without status epilepticus: Secondary | ICD-10-CM | POA: Diagnosis not present

## 2022-02-21 DIAGNOSIS — O99283 Endocrine, nutritional and metabolic diseases complicating pregnancy, third trimester: Secondary | ICD-10-CM | POA: Insufficient documentation

## 2022-02-28 ENCOUNTER — Ambulatory Visit (INDEPENDENT_AMBULATORY_CARE_PROVIDER_SITE_OTHER): Payer: Medicaid Other | Admitting: Advanced Practice Midwife

## 2022-02-28 ENCOUNTER — Ambulatory Visit: Payer: Medicaid Other | Attending: Obstetrics and Gynecology

## 2022-02-28 ENCOUNTER — Ambulatory Visit: Payer: Medicaid Other | Admitting: *Deleted

## 2022-02-28 VITALS — BP 118/72 | HR 84 | Wt 169.6 lb

## 2022-02-28 VITALS — BP 114/61 | HR 79

## 2022-02-28 DIAGNOSIS — Z3A34 34 weeks gestation of pregnancy: Secondary | ICD-10-CM

## 2022-02-28 DIAGNOSIS — E0590 Thyrotoxicosis, unspecified without thyrotoxic crisis or storm: Secondary | ICD-10-CM | POA: Diagnosis not present

## 2022-02-28 DIAGNOSIS — G40909 Epilepsy, unspecified, not intractable, without status epilepticus: Secondary | ICD-10-CM | POA: Insufficient documentation

## 2022-02-28 DIAGNOSIS — O99283 Endocrine, nutritional and metabolic diseases complicating pregnancy, third trimester: Secondary | ICD-10-CM | POA: Insufficient documentation

## 2022-02-28 DIAGNOSIS — O99353 Diseases of the nervous system complicating pregnancy, third trimester: Secondary | ICD-10-CM | POA: Diagnosis present

## 2022-02-28 DIAGNOSIS — E0500 Thyrotoxicosis with diffuse goiter without thyrotoxic crisis or storm: Secondary | ICD-10-CM | POA: Diagnosis present

## 2022-02-28 DIAGNOSIS — R569 Unspecified convulsions: Secondary | ICD-10-CM

## 2022-02-28 DIAGNOSIS — O0993 Supervision of high risk pregnancy, unspecified, third trimester: Secondary | ICD-10-CM

## 2022-02-28 DIAGNOSIS — Z362 Encounter for other antenatal screening follow-up: Secondary | ICD-10-CM

## 2022-02-28 DIAGNOSIS — O0990 Supervision of high risk pregnancy, unspecified, unspecified trimester: Secondary | ICD-10-CM

## 2022-02-28 NOTE — Progress Notes (Signed)
   PRENATAL VISIT NOTE  Subjective:  Megan Cruz is a 21 y.o. G1P0000 at [redacted]w[redacted]d being seen today for ongoing prenatal care.  She is currently monitored for the following issues for this high-risk pregnancy and has Seizure-like activity (HCC); Parasomnia; Hyperthyroidism; Supervision of high risk pregnancy, antepartum; LGSIL on Pap smear of cervix; and Graves disease on their problem list.  Patient reports no complaints.  Contractions: Not present. Vag. Bleeding: None.  Movement: Present. Denies leaking of fluid.   The following portions of the patient's history were reviewed and updated as appropriate: allergies, current medications, past family history, past medical history, past social history, past surgical history and problem list.   Objective:   Vitals:   02/28/22 1337  BP: 118/72  Pulse: 84  Weight: 169 lb 9.6 oz (76.9 kg)    Fetal Status: Fetal Heart Rate (bpm): 147   Movement: Present     General:  Alert, oriented and cooperative. Patient is in no acute distress.  Skin: Skin is warm and dry. No rash noted.   Cardiovascular: Normal heart rate noted  Respiratory: Normal respiratory effort, no problems with respiration noted  Abdomen: Soft, gravid, appropriate for gestational age.  Pain/Pressure: Absent     Pelvic: Cervical exam deferred        Extremities: Normal range of motion.  Edema: None  Mental Status: Normal mood and affect. Normal behavior. Normal judgment and thought content.   Assessment and Plan:  Pregnancy: G1P0000 at [redacted]w[redacted]d 1. Supervision of high risk pregnancy, antepartum --Anticipatory guidance about next visits/weeks of pregnancy given.   2. Hyperthyroidism --On methimazole --Weekly BPP with MFM  3. Seizure-like activity (HCC) --Hx seizures, on Keppra   Preterm labor symptoms and general obstetric precautions including but not limited to vaginal bleeding, contractions, leaking of fluid and fetal movement were reviewed in detail with the  patient. Please refer to After Visit Summary for other counseling recommendations.   Return in about 2 weeks (around 03/14/2022) for Any provider, cancel 7/13 appt with Dr Macon Large.  Future Appointments  Date Time Provider Department Center  03/03/2022  1:30 PM Anyanwu, Jethro Bastos, MD [facility] None  03/07/2022  1:15 PM [facility] NURSE [facility]  03/07/2022  1:30 PM [facility] US2 [facility]  03/14/2022  8:30 AM [facility] NURSE [facility]  03/14/2022  8:45 AM [facility] US5 [facility]  03/14/2022  1:30 PM Leftwich-Kirby, Leaann Nevils A, CNM [facility] None  03/21/2022 12:30 PM [facility] NURSE [facility]  03/21/2022 12:45 PM [facility] US5 [facility]  03/28/2022 10:45 AM [facility] NURSE [facility]  03/28/2022 11:00 AM [facility] US1 [facility]  03/28/2022  1:30 PM Leftwich-Kirby, Torii Royse A, CNM [facility] None  04/11/2022  1:30 PM Leftwich-Kirby, Wilmer Floor, CNM [facility] None  04/18/2022  1:30 PM Leftwich-Kirby, Wilmer Floor, CNM [facility] None  06/08/2022 11:30 AM Van Clines, MD [facility] None    Sharen Counter, CNM

## 2022-03-03 ENCOUNTER — Encounter: Payer: Medicaid Other | Admitting: Obstetrics & Gynecology

## 2022-03-07 ENCOUNTER — Ambulatory Visit: Payer: Medicaid Other | Attending: Obstetrics and Gynecology

## 2022-03-07 ENCOUNTER — Ambulatory Visit: Payer: Medicaid Other | Admitting: *Deleted

## 2022-03-07 ENCOUNTER — Encounter: Payer: Self-pay | Admitting: *Deleted

## 2022-03-07 VITALS — BP 114/68 | HR 89

## 2022-03-07 DIAGNOSIS — E0590 Thyrotoxicosis, unspecified without thyrotoxic crisis or storm: Secondary | ICD-10-CM | POA: Diagnosis not present

## 2022-03-07 DIAGNOSIS — E0500 Thyrotoxicosis with diffuse goiter without thyrotoxic crisis or storm: Secondary | ICD-10-CM | POA: Insufficient documentation

## 2022-03-07 DIAGNOSIS — Z3A35 35 weeks gestation of pregnancy: Secondary | ICD-10-CM

## 2022-03-07 DIAGNOSIS — G40909 Epilepsy, unspecified, not intractable, without status epilepticus: Secondary | ICD-10-CM | POA: Diagnosis not present

## 2022-03-07 DIAGNOSIS — O99353 Diseases of the nervous system complicating pregnancy, third trimester: Secondary | ICD-10-CM | POA: Diagnosis present

## 2022-03-07 DIAGNOSIS — O99283 Endocrine, nutritional and metabolic diseases complicating pregnancy, third trimester: Secondary | ICD-10-CM | POA: Diagnosis not present

## 2022-03-07 DIAGNOSIS — O0990 Supervision of high risk pregnancy, unspecified, unspecified trimester: Secondary | ICD-10-CM

## 2022-03-14 ENCOUNTER — Ambulatory Visit: Payer: Medicaid Other | Attending: Obstetrics

## 2022-03-14 ENCOUNTER — Other Ambulatory Visit (HOSPITAL_COMMUNITY)
Admission: RE | Admit: 2022-03-14 | Discharge: 2022-03-14 | Disposition: A | Discharge status: Home / Self Care | Payer: Medicaid Other | Source: Ambulatory Visit | Attending: Advanced Practice Midwife | Admitting: Advanced Practice Midwife

## 2022-03-14 ENCOUNTER — Ambulatory Visit (INDEPENDENT_AMBULATORY_CARE_PROVIDER_SITE_OTHER): Payer: Medicaid Other | Admitting: Advanced Practice Midwife

## 2022-03-14 ENCOUNTER — Ambulatory Visit: Payer: Medicaid Other | Admitting: *Deleted

## 2022-03-14 VITALS — BP 111/63 | HR 60

## 2022-03-14 VITALS — BP 109/72 | HR 87 | Wt 172.4 lb

## 2022-03-14 DIAGNOSIS — E0590 Thyrotoxicosis, unspecified without thyrotoxic crisis or storm: Secondary | ICD-10-CM

## 2022-03-14 DIAGNOSIS — O99353 Diseases of the nervous system complicating pregnancy, third trimester: Secondary | ICD-10-CM

## 2022-03-14 DIAGNOSIS — G40909 Epilepsy, unspecified, not intractable, without status epilepticus: Secondary | ICD-10-CM

## 2022-03-14 DIAGNOSIS — O0990 Supervision of high risk pregnancy, unspecified, unspecified trimester: Secondary | ICD-10-CM

## 2022-03-14 DIAGNOSIS — O99283 Endocrine, nutritional and metabolic diseases complicating pregnancy, third trimester: Secondary | ICD-10-CM | POA: Diagnosis present

## 2022-03-14 DIAGNOSIS — Z3A36 36 weeks gestation of pregnancy: Secondary | ICD-10-CM

## 2022-03-14 DIAGNOSIS — R569 Unspecified convulsions: Secondary | ICD-10-CM

## 2022-03-14 NOTE — Progress Notes (Signed)
   PRENATAL VISIT NOTE  Subjective:  Megan Cruz is a 21 y.o. G1P0000 at [redacted]w[redacted]d being seen today for ongoing prenatal care.  She is currently monitored for the following issues for this high-risk pregnancy and has Seizure-like activity (HCC); Parasomnia; Hyperthyroidism; Supervision of high risk pregnancy, antepartum; LGSIL on Pap smear of cervix; and Graves disease on their problem list.  Patient reports no complaints.  Contractions: Not present. Vag. Bleeding: None.  Movement: Present. Denies leaking of fluid.   The following portions of the patient's history were reviewed and updated as appropriate: allergies, current medications, past family history, past medical history, past social history, past surgical history and problem list.   Objective:   Vitals:   03/14/22 1339  BP: 109/72  Pulse: 87  Weight: 172 lb 6.4 oz (78.2 kg)    Fetal Status: Fetal Heart Rate (bpm): 148 Fundal Height: 36 cm Movement: Present  Presentation: Vertex  General:  Alert, oriented and cooperative. Patient is in no acute distress.  Skin: Skin is warm and dry. No rash noted.   Cardiovascular: Normal heart rate noted  Respiratory: Normal respiratory effort, no problems with respiration noted  Abdomen: Soft, gravid, appropriate for gestational age.  Pain/Pressure: Absent     Pelvic: Cervical exam deferred        Extremities: Normal range of motion.  Edema: None  Mental Status: Normal mood and affect. Normal behavior. Normal judgment and thought content.   Assessment and Plan:  Pregnancy: G1P0000 at [redacted]w[redacted]d 1. Supervision of high risk pregnancy, antepartum --Anticipatory guidance about next visits/weeks of pregnancy given.   - Cervicovaginal ancillary only( [place]) - Strep Gp B NAA  2. Hyperthyroidism --On methimazole, no s/sx --Weekly BPP, 8/8 today  3. Seizure-like activity (HCC)   4. [redacted] weeks gestation of pregnancy   Preterm labor symptoms and general obstetric precautions including  but not limited to vaginal bleeding, contractions, leaking of fluid and fetal movement were reviewed in detail with the patient. Please refer to After Visit Summary for other counseling recommendations.   Return in about 1 week (around 03/21/2022) for HROB.  Future Appointments  Date Time Provider Department Center  03/21/2022 12:30 PM [facility] NURSE [facility]  03/21/2022 12:45 PM [facility] US5 [facility]  03/28/2022 10:45 AM [facility] NURSE [facility]  03/28/2022 11:00 AM [facility] US1 [facility]  03/28/2022  1:30 PM Leftwich-Kirby, Wilmer Floor, CNM [facility] None  04/11/2022  1:30 PM Leftwich-Kirby, Wilmer Floor, CNM [facility] None  04/18/2022  1:30 PM Leftwich-Kirby, Wilmer Floor, CNM [facility] None  06/08/2022 11:30 AM Van Clines, MD [facility] None    Sharen Counter, CNM

## 2022-03-14 NOTE — Progress Notes (Signed)
Pt reports fetal movement, denies pain.  

## 2022-03-14 NOTE — Patient Instructions (Signed)
Things to Try After 37 weeks to Encourage Labor/Get Ready for Labor:    Try the Miles Circuit at www.milescircuit.com daily to improve baby's position and encourage the onset of labor.  Walk a little and rest a little every day.  Change positions often.  Cervical Ripening: May try one or both Red Raspberry Leaf capsules or tea:  two 300mg or 400mg tablets with each meal, 2-3 times a day, or 1-3 cups of tea daily  Potential Side Effects Of Raspberry Leaf:  Most women do not experience any side effects from drinking raspberry leaf tea. However, nausea and loose stools are possible   Evening Primrose Oil capsules: take 1 capsule by mouth and place one capsule in the vagina every night.    Some of the potential side effects:  Upset stomach  Loose stools or diarrhea  Headaches  Nausea  Sex can also help the cervix ripen and encourage labor onset.    Labor Precautions Reasons to come to [facility]U at [facility]:  1.  Contractions are  5 minutes apart or less, each last 1 minute, these have been going on for 1-2 hours, and you cannot walk or talk during them 2.  You have a large gush of fluid, or a trickle of fluid that will not stop and you have to wear a pad 3.  You have bleeding that is bright red, heavier than spotting--like menstrual bleeding (spotting can be normal in early labor or after a check of your cervix) 4.  You do not feel the baby moving like he/she normally does  

## 2022-03-15 LAB — CERVICOVAGINAL ANCILLARY ONLY
Chlamydia: NEGATIVE
Comment: NEGATIVE
Comment: NORMAL
Neisseria Gonorrhea: NEGATIVE

## 2022-03-16 LAB — STREP GP B NAA: Strep Gp B NAA: NEGATIVE

## 2022-03-21 ENCOUNTER — Ambulatory Visit: Payer: Medicaid Other | Attending: Obstetrics

## 2022-03-21 ENCOUNTER — Encounter: Payer: Self-pay | Admitting: *Deleted

## 2022-03-21 ENCOUNTER — Ambulatory Visit: Payer: Medicaid Other | Admitting: *Deleted

## 2022-03-21 VITALS — BP 111/73 | HR 86

## 2022-03-21 DIAGNOSIS — E0590 Thyrotoxicosis, unspecified without thyrotoxic crisis or storm: Secondary | ICD-10-CM | POA: Diagnosis not present

## 2022-03-21 DIAGNOSIS — O0990 Supervision of high risk pregnancy, unspecified, unspecified trimester: Secondary | ICD-10-CM | POA: Insufficient documentation

## 2022-03-21 DIAGNOSIS — O99353 Diseases of the nervous system complicating pregnancy, third trimester: Secondary | ICD-10-CM | POA: Diagnosis not present

## 2022-03-21 DIAGNOSIS — O99283 Endocrine, nutritional and metabolic diseases complicating pregnancy, third trimester: Secondary | ICD-10-CM | POA: Insufficient documentation

## 2022-03-21 DIAGNOSIS — Z3A37 37 weeks gestation of pregnancy: Secondary | ICD-10-CM

## 2022-03-21 DIAGNOSIS — G40909 Epilepsy, unspecified, not intractable, without status epilepticus: Secondary | ICD-10-CM | POA: Insufficient documentation

## 2022-03-28 ENCOUNTER — Ambulatory Visit (INDEPENDENT_AMBULATORY_CARE_PROVIDER_SITE_OTHER): Payer: Medicaid Other | Admitting: Advanced Practice Midwife

## 2022-03-28 ENCOUNTER — Encounter: Payer: Self-pay | Admitting: *Deleted

## 2022-03-28 ENCOUNTER — Ambulatory Visit: Payer: Medicaid Other | Attending: Obstetrics

## 2022-03-28 ENCOUNTER — Ambulatory Visit: Payer: Medicaid Other | Admitting: *Deleted

## 2022-03-28 VITALS — BP 111/62 | HR 64

## 2022-03-28 VITALS — BP 117/79 | HR 85 | Wt 174.6 lb

## 2022-03-28 DIAGNOSIS — O0990 Supervision of high risk pregnancy, unspecified, unspecified trimester: Secondary | ICD-10-CM

## 2022-03-28 DIAGNOSIS — Z3A38 38 weeks gestation of pregnancy: Secondary | ICD-10-CM

## 2022-03-28 DIAGNOSIS — R569 Unspecified convulsions: Secondary | ICD-10-CM

## 2022-03-28 DIAGNOSIS — E0590 Thyrotoxicosis, unspecified without thyrotoxic crisis or storm: Secondary | ICD-10-CM | POA: Insufficient documentation

## 2022-03-28 DIAGNOSIS — G40909 Epilepsy, unspecified, not intractable, without status epilepticus: Secondary | ICD-10-CM | POA: Insufficient documentation

## 2022-03-28 DIAGNOSIS — O99283 Endocrine, nutritional and metabolic diseases complicating pregnancy, third trimester: Secondary | ICD-10-CM | POA: Diagnosis present

## 2022-03-28 DIAGNOSIS — O99353 Diseases of the nervous system complicating pregnancy, third trimester: Secondary | ICD-10-CM | POA: Insufficient documentation

## 2022-03-28 NOTE — Progress Notes (Signed)
Pt presents for ROB at [redacted]w[redacted]d. Reports +fetal movement. Denies vaginal bleeding, contractions, pain/pressure.

## 2022-03-28 NOTE — Progress Notes (Signed)
   PRENATAL VISIT NOTE  Subjective:  Megan Cruz is a 21 y.o. G1P0000 at [redacted]w[redacted]d being seen today for ongoing prenatal care.  She is currently monitored for the following issues for this high-risk pregnancy and has Seizure-like activity (HCC); Parasomnia; Hyperthyroidism; Supervision of high risk pregnancy, antepartum; LGSIL on Pap smear of cervix; and Graves disease on their problem list.  Patient reports no complaints.  Contractions: Not present. Vag. Bleeding: None.  Movement: Present. Denies leaking of fluid.   The following portions of the patient's history were reviewed and updated as appropriate: allergies, current medications, past family history, past medical history, past social history, past surgical history and problem list.   Objective:   Vitals:   03/28/22 1332  BP: 117/79  Pulse: 85  Weight: 174 lb 9.6 oz (79.2 kg)    Fetal Status: Fetal Heart Rate (bpm): 138 Fundal Height: 38 cm Movement: Present  Presentation: Vertex  General:  Alert, oriented and cooperative. Patient is in no acute distress.  Skin: Skin is warm and dry. No rash noted.   Cardiovascular: Normal heart rate noted  Respiratory: Normal respiratory effort, no problems with respiration noted  Abdomen: Soft, gravid, appropriate for gestational age.  Pain/Pressure: Absent     Pelvic: Cervical exam deferred        Extremities: Normal range of motion.  Edema: None  Mental Status: Normal mood and affect. Normal behavior. Normal judgment and thought content.   Assessment and Plan:  Pregnancy: G1P0000 at [redacted]w[redacted]d 1. Supervision of high risk pregnancy, antepartum --Anticipatory guidance about next visits/weeks of pregnancy given.   2. Hyperthyroidism --Pt still taking methimazole. MFM recommended stopping medication, pt has questions today.  Pt was on medicine before pregnancy so was not sure she should stop.   --Discussed that MFM recommended stopping meds before delivery due to risk of hypothyroid for  baby --Per MFM guidelines for antenatal testing/delivery, IOL at 39 weeks recommended for hyperthyroidism on medications.  IOL scheduling form sent for 8/11-8/13/23.   --Vertex position confirmed by Leopolds today. --Discussed IOL process, expected lengths of time, etc. with pt.  Questions answered.   --IOL orders placed  3. Seizure-like activity (HCC) --On Keppra  4. [redacted] weeks gestation of pregnancy   Term labor symptoms and general obstetric precautions including but not limited to vaginal bleeding, contractions, leaking of fluid and fetal movement were reviewed in detail with the patient. Please refer to After Visit Summary for other counseling recommendations.   No follow-ups on file.  Future Appointments  Date Time Provider Department Center  04/04/2022  3:00 PM [facility] NURSE [facility]  04/04/2022  3:15 PM [facility] NST [facility]  04/11/2022  1:30 PM Leftwich-Kirby, Wilmer Floor, CNM [facility] None  04/18/2022  1:30 PM Leftwich-Kirby, Wilmer Floor, CNM [facility] None  06/08/2022 11:30 AM Van Clines, MD [facility] None    Sharen Counter, CNM

## 2022-03-29 ENCOUNTER — Encounter (HOSPITAL_COMMUNITY): Payer: Self-pay

## 2022-03-29 ENCOUNTER — Telehealth (HOSPITAL_COMMUNITY): Payer: Self-pay | Admitting: *Deleted

## 2022-03-29 NOTE — Telephone Encounter (Signed)
Preadmission screen  

## 2022-03-30 ENCOUNTER — Encounter (HOSPITAL_COMMUNITY): Payer: Self-pay | Admitting: *Deleted

## 2022-03-30 ENCOUNTER — Telehealth (HOSPITAL_COMMUNITY): Payer: Self-pay | Admitting: *Deleted

## 2022-03-30 NOTE — Telephone Encounter (Signed)
Preadmission screen  

## 2022-04-03 ENCOUNTER — Inpatient Hospital Stay (HOSPITAL_COMMUNITY)
Admission: AD | Admit: 2022-04-03 | Discharge: 2022-04-06 | DRG: 807 | Disposition: A | Discharge status: Home / Self Care | Payer: Medicaid Other | Attending: Family Medicine | Admitting: Family Medicine

## 2022-04-03 ENCOUNTER — Other Ambulatory Visit: Payer: Self-pay

## 2022-04-03 ENCOUNTER — Inpatient Hospital Stay (HOSPITAL_COMMUNITY): Payer: Medicaid Other

## 2022-04-03 ENCOUNTER — Encounter (HOSPITAL_COMMUNITY): Payer: Self-pay | Admitting: Family Medicine

## 2022-04-03 DIAGNOSIS — G40909 Epilepsy, unspecified, not intractable, without status epilepticus: Secondary | ICD-10-CM | POA: Diagnosis present

## 2022-04-03 DIAGNOSIS — Z3A39 39 weeks gestation of pregnancy: Secondary | ICD-10-CM | POA: Diagnosis not present

## 2022-04-03 DIAGNOSIS — R87612 Low grade squamous intraepithelial lesion on cytologic smear of cervix (LGSIL): Secondary | ICD-10-CM | POA: Diagnosis present

## 2022-04-03 DIAGNOSIS — O0990 Supervision of high risk pregnancy, unspecified, unspecified trimester: Principal | ICD-10-CM

## 2022-04-03 DIAGNOSIS — O99284 Endocrine, nutritional and metabolic diseases complicating childbirth: Secondary | ICD-10-CM | POA: Diagnosis present

## 2022-04-03 DIAGNOSIS — O9902 Anemia complicating childbirth: Secondary | ICD-10-CM | POA: Diagnosis present

## 2022-04-03 DIAGNOSIS — E0590 Thyrotoxicosis, unspecified without thyrotoxic crisis or storm: Secondary | ICD-10-CM

## 2022-04-03 DIAGNOSIS — O99280 Endocrine, nutritional and metabolic diseases complicating pregnancy, unspecified trimester: Secondary | ICD-10-CM | POA: Diagnosis present

## 2022-04-03 DIAGNOSIS — O99354 Diseases of the nervous system complicating childbirth: Secondary | ICD-10-CM | POA: Diagnosis present

## 2022-04-03 DIAGNOSIS — O99214 Obesity complicating childbirth: Secondary | ICD-10-CM | POA: Diagnosis present

## 2022-04-03 DIAGNOSIS — E0500 Thyrotoxicosis with diffuse goiter without thyrotoxic crisis or storm: Secondary | ICD-10-CM | POA: Diagnosis not present

## 2022-04-03 DIAGNOSIS — D509 Iron deficiency anemia, unspecified: Secondary | ICD-10-CM | POA: Diagnosis present

## 2022-04-03 HISTORY — DX: Thyrotoxicosis, unspecified without thyrotoxic crisis or storm: E05.90

## 2022-04-03 LAB — CBC
HCT: 25.8 % — ABNORMAL LOW (ref 36.0–46.0)
Hemoglobin: 8.2 g/dL — ABNORMAL LOW (ref 12.0–15.0)
MCH: 24.5 pg — ABNORMAL LOW (ref 26.0–34.0)
MCHC: 31.8 g/dL (ref 30.0–36.0)
MCV: 77 fL — ABNORMAL LOW (ref 80.0–100.0)
Platelets: 238 10*3/uL (ref 150–400)
RBC: 3.35 MIL/uL — ABNORMAL LOW (ref 3.87–5.11)
RDW: 14.6 % (ref 11.5–15.5)
WBC: 9.6 10*3/uL (ref 4.0–10.5)
nRBC: 0.3 % — ABNORMAL HIGH (ref 0.0–0.2)

## 2022-04-03 MED ORDER — OXYTOCIN-SODIUM CHLORIDE 30-0.9 UT/500ML-% IV SOLN
1.0000 m[IU]/min | INTRAVENOUS | Status: DC
  Administered 2022-04-03: 2 m[IU]/min via INTRAVENOUS

## 2022-04-03 MED ORDER — ACETAMINOPHEN 325 MG PO TABS: 650.0000 mg | ORAL_TABLET | ORAL | Status: DC | PRN

## 2022-04-03 MED ORDER — OXYTOCIN BOLUS FROM INFUSION
333.0000 mL | Freq: Once | INTRAVENOUS | Status: AC
  Administered 2022-04-04: 333 mL via INTRAVENOUS

## 2022-04-03 MED ORDER — OXYTOCIN-SODIUM CHLORIDE 30-0.9 UT/500ML-% IV SOLN
2.5000 [IU]/h | INTRAVENOUS | Status: DC
  Administered 2022-04-04: 2.5 [IU]/h via INTRAVENOUS
  Filled 2022-04-03: qty 500

## 2022-04-03 MED ORDER — PHENYLEPHRINE 80 MCG/ML (10ML) SYRINGE FOR IV PUSH (FOR BLOOD PRESSURE SUPPORT): 80.0000 ug | PREFILLED_SYRINGE | INTRAVENOUS | Status: DC | PRN

## 2022-04-03 MED ORDER — LEVETIRACETAM ER 500 MG PO TB24
1500.0000 mg | ORAL_TABLET | Freq: Two times a day (BID) | ORAL | Status: DC
Start: 2022-04-03 — End: 2022-04-06
  Administered 2022-04-03 – 2022-04-06 (×6): 1500 mg via ORAL
  Filled 2022-04-03 (×7): qty 3

## 2022-04-03 MED ORDER — LACTATED RINGERS IV SOLN
500.0000 mL | INTRAVENOUS | Status: DC | PRN
  Administered 2022-04-04: 1000 mL via INTRAVENOUS

## 2022-04-03 MED ORDER — FENTANYL-BUPIVACAINE-NACL 0.5-0.125-0.9 MG/250ML-% EP SOLN
12.0000 mL/h | EPIDURAL | Status: DC | PRN
  Administered 2022-04-04: 10.5 mL/h via EPIDURAL
  Filled 2022-04-03: qty 250

## 2022-04-03 MED ORDER — EPHEDRINE 5 MG/ML INJ: 10.0000 mg | INTRAVENOUS | Status: DC | PRN

## 2022-04-03 MED ORDER — TERBUTALINE SULFATE 1 MG/ML IJ SOLN: 0.2500 mg | Freq: Once | INTRAMUSCULAR | Status: DC | PRN

## 2022-04-03 MED ORDER — ONDANSETRON HCL 4 MG/2ML IJ SOLN: 4.0000 mg | Freq: Four times a day (QID) | INTRAMUSCULAR | Status: DC | PRN

## 2022-04-03 MED ORDER — SOD CITRATE-CITRIC ACID 500-334 MG/5ML PO SOLN: 30.0000 mL | ORAL | Status: DC | PRN

## 2022-04-03 MED ORDER — FENTANYL CITRATE (PF) 100 MCG/2ML IJ SOLN
100.0000 ug | INTRAMUSCULAR | Status: DC | PRN
  Administered 2022-04-03: 100 ug via INTRAVENOUS
  Filled 2022-04-03: qty 2

## 2022-04-03 MED ORDER — DIPHENHYDRAMINE HCL 50 MG/ML IJ SOLN: 12.5000 mg | INTRAMUSCULAR | Status: DC | PRN

## 2022-04-03 MED ORDER — LIDOCAINE HCL (PF) 1 % IJ SOLN: 30.0000 mL | INTRAMUSCULAR | Status: DC | PRN

## 2022-04-03 MED ORDER — LACTATED RINGERS IV SOLN: INTRAVENOUS | Status: DC

## 2022-04-03 MED ORDER — OXYCODONE-ACETAMINOPHEN 5-325 MG PO TABS: 1.0000 | ORAL_TABLET | ORAL | Status: DC | PRN

## 2022-04-03 MED ORDER — LACTATED RINGERS IV SOLN
500.0000 mL | Freq: Once | INTRAVENOUS | Status: AC
  Administered 2022-04-04: 500 mL via INTRAVENOUS

## 2022-04-03 MED ORDER — OXYCODONE-ACETAMINOPHEN 5-325 MG PO TABS: 2.0000 | ORAL_TABLET | ORAL | Status: DC | PRN

## 2022-04-03 MED ORDER — MISOPROSTOL 50MCG HALF TABLET
50.0000 ug | ORAL_TABLET | ORAL | Status: DC | PRN
  Administered 2022-04-03: 50 ug via BUCCAL
  Filled 2022-04-03: qty 1

## 2022-04-03 NOTE — H&P (Addendum)
OBSTETRIC ADMISSION HISTORY AND PHYSICAL  Megan Cruz is a 21 y.o. female G1P0000 with IUP at 8w1dby U/S presenting for IOL for hyperthyroidism. She reports +FMs, No LOF, no VB, no blurry vision, headaches or peripheral edema, and RUQ pain.  She plans on breast feeding. She is undecided for birth control. [place] [facility] --->  Estimated Date of Delivery: 04/09/22  Sono:    _0 , CWD, normal anatomy, cephalic presentation,  [id number] 13% EFW   Prenatal History/Complications:  - hyperthyroidism on methimazole, Hx of Graves disease - seizures, on Keppra  - LGSIL on pap   Past Medical History: Past Medical History:  Diagnosis Date   Epilepsia ([facility]    Hyperthyroidism     Past Surgical History: Past Surgical History:  Procedure Laterality Date   APPENDECTOMY     CYST REMOVAL PEDIATRIC Right 07/22/2013   Procedure: CYST REMOVAL PEDIATRIC;  Surgeon: MJerilynn Mages SGerald Stabs MD;  [facility]  Service: Pediatrics;  Laterality: Right;   LAPAROSCOPIC APPENDECTOMY N/A 07/22/2013   Procedure: APPENDECTOMY LAPAROSCOPIC;  Surgeon: MJerilynn Mages SGerald Stabs MD;  [place] [facility]  Service: Pediatrics;  Laterality: N/A;    Obstetrical History: OB History     Gravida  1   Para  0   Term  0   Preterm  0   AB  0   Living         SAB  0   IAB  0   Ectopic  0   Multiple      Live Births              Social History Social History   Socioeconomic History   Marital status: Single    Spouse name: Not on file   Number of children: Not on file   Years of education: Not on file   Highest education level: Not on file  Occupational History   Not on file  Tobacco Use   Smoking status: Never    Passive exposure: Yes   Smokeless tobacco: Never  Vaping Use   Vaping Use: Never used  Substance and Sexual Activity   Alcohol use: No   Drug use: No   Sexual activity: Yes    Partners: Male    Birth control/protection: None   Other Topics Concern   Not on file  Social History Narrative   Patient lives with mom  She has graduated and is not attending college at the moment.    Right handed    Two story apartment       Social Determinants of Health   Financial Resource Strain: Not on file  Food Insecurity: Not on file  Transportation Needs: Not on file  Physical Activity: Not on file  Stress: Not on file  Social Connections: Not on file    Family History: Family History  Problem Relation Age of Onset   Migraines Sister    Seizures Neg Hx    Autism Neg Hx    ADD / ADHD Neg Hx    Anxiety disorder Neg Hx    Depression Neg Hx    Bipolar disorder Neg Hx    Schizophrenia Neg Hx     Allergies: No Known Allergies  Pt denies allergies to latex, iodine, or shellfish.  Medications Prior to Admission  Medication Sig Dispense Refill Last Dose   levETIRAcetam (KEPPRA XR) 500 MG 24 hr tablet Take 3 tablets twice a day 180 tablet  6 04/02/2022   Prenatal Vit-Fe Fumarate-FA (PRENATAL MULTIVITAMIN) TABS tablet Take 1 tablet by mouth daily at 12 noon.   04/02/2022   Blood Pressure Monitor KIT Use blood pressure monitor to check BP weekly (Patient not taking: Reported on 01/24/2022) 1 kit 0    methimazole (TAPAZOLE) 5 MG tablet Take 2 tablets (10 mg total) by mouth 2 (two) times daily. 120 tablet 4 03/28/2022   Misc. Devices (GOJJI WEIGHT SCALE) MISC Use scale to check weight once weekly (Patient not taking: Reported on 01/24/2022) 1 each 0      Review of Systems   All systems reviewed and negative except as stated in HPI  Height 5' (1.524 m), weight 79.2 kg, last menstrual period 06/27/2021. General appearance: alert and cooperative Lungs: clear to auscultation bilaterally Heart: regular rate and rhythm Abdomen: soft, non-tender; bowel sounds normal Extremities: Homans sign is negative, no sign of DVT Presentation: cephalic Fetal monitoringBaseline: 150 bpm, Variability: Good {> 6 bpm), Accelerations:  Reactive, and Decelerations: Absent Uterine activityNone     Prenatal labs: ABO, Rh: O/Positive/-- (03/07 1153) Antibody: Negative (03/07 1153) Rubella: 7.28 (03/07 1153) RPR: Non Reactive (06/27 1006)  HBsAg: Negative (03/07 1153)  HIV: Non Reactive (06/27 1006)  GBS: Negative/-- (07/24 1423)  2 hr Glucola: normal  Genetic screening:  declines Anatomy US: normal   Prenatal Transfer Tool  Maternal Diabetes: No Genetic Screening: Normal Maternal Ultrasounds/Referrals: Normal Fetal Ultrasounds or other Referrals:  None Maternal Substance Abuse:  No Significant Maternal Medications:  Meds include: Other: Methimazole, and Keppra  Significant Maternal Lab Results: Group B Strep negative  No results found for this or any previous visit (from the past 24 hour(s)).  Patient Active Problem List   Diagnosis Date Noted   Hyperthyroidism complicating pregnancy 21/74/7159   Graves disease 11/02/2021   LGSIL on Pap smear of cervix 10/28/2021   Supervision of high risk pregnancy, antepartum 10/20/2021   Hyperthyroidism 07/28/2021   Seizure-like activity ([place]) 01/22/2018   Parasomnia 01/22/2018    Assessment/Plan:  Megan Cruz is a 21 y.o. G1P0000 at 34w1dhere for IOL for hyperthyroidism.   #Labor: IOL explained to patient, she is amendable to augmentation as appropriate.  #Pain: Undecided, IV pain medicine, laughing gas, and epidural explained and offered. #FWB: Cat 1 #ID:  GBS neg #MOF: breast #MOC: undecided  Hyperthyroidism:  - MFM recommended discontinuing methimazole prior to IOL to prevent hypothyroid for the baby  - patient reports stopping medication Monday 8/7  Hx of Seizures:  - on Keppra  - has continued to take medication   EDarci Current DO[place]for [place] Holiday8/13/2023, 2:46 PM  Attestation of Supervision of Resident:  I confirm that I have verified the information documented in the  resident's  note and that I  have also personally reperformed the history, physical exam and all medical decision making activities.  I have verified that all services and findings are accurately documented in this student's note; and I agree with management and plan as outlined in the documentation. I have also made any necessary editorial changes.  HMarcille BuffyDNP, CNM  04/03/22  5:30 PM

## 2022-04-03 NOTE — Progress Notes (Addendum)
Labor Progress Note Megan Cruz is a 21 y.o. G1P0000 at [redacted]w[redacted]d presented for IOL for hyperthyroidism.  S: Patient is resting comfortably. Ready for next check.   O:  BP 113/71   Pulse 85   Temp 99.2 F (37.3 C) (Oral)   Resp 17   Ht 5' (1.524 m)   Wt 79.2 kg   LMP 06/27/2021 (Within Months)   BMI 34.12 kg/m  EFM: 160/+ accelerations/no decelerations   CVE: 1-2/60/-2   A&P: 21 y.o. G1P0000 [redacted]w[redacted]d  #Labor: Progressing well. [place] Balloon placed 1443 by Thressa Sheller, CNM.  #Pain: planning epidural  #FWB: Cat 1 #GBS negative  Hx of seizure:  - 1500 mg Keppra ordered to continue home medication   Glendale Chard, DO [facility] [employer] 7:39 PM  Attestation of Supervision of Resident:  I confirm that I have verified the information documented in the  resident's  note and that I have also personally reperformed the history, physical exam and all medical decision making activities.  I have verified that all services and findings are accurately documented in this student's note; and I agree with management and plan as outlined in the documentation. I have also made any necessary editorial changes.  Procedure: Patient informed of R/B/A of procedure.  Patient has been on the monitor with a reactive tracing as of the start of the procedure.   Procedure done to begin ripening of the cervix for induction of labor. Appropriate time out taken. The patient was placed in the lithotomy position and a cervical exam was performed and a finger was used to guide the 45F foley", Cook Catheter through the internal os of the cervix. Both cook catheter balloons filled with 60cc of normal saline. Plug inserted into end of the foley. Foley placed on tension and taped to medial thigh.    Thressa Sheller DNP, CNM  04/03/22  7:43 PM

## 2022-04-04 ENCOUNTER — Inpatient Hospital Stay (HOSPITAL_COMMUNITY): Payer: Medicaid Other | Admitting: Anesthesiology

## 2022-04-04 ENCOUNTER — Other Ambulatory Visit: Payer: Self-pay

## 2022-04-04 ENCOUNTER — Ambulatory Visit: Payer: Medicaid Other

## 2022-04-04 ENCOUNTER — Encounter (HOSPITAL_COMMUNITY): Payer: Self-pay | Admitting: Family Medicine

## 2022-04-04 DIAGNOSIS — Z3A39 39 weeks gestation of pregnancy: Secondary | ICD-10-CM

## 2022-04-04 DIAGNOSIS — O99284 Endocrine, nutritional and metabolic diseases complicating childbirth: Secondary | ICD-10-CM

## 2022-04-04 LAB — RPR: RPR Ser Ql: NONREACTIVE

## 2022-04-04 LAB — TSH: TSH: 1.069 u[IU]/mL (ref 0.350–4.500)

## 2022-04-04 LAB — T4, FREE: Free T4: 1.02 ng/dL (ref 0.61–1.12)

## 2022-04-04 MED ORDER — WITCH HAZEL-GLYCERIN EX PADS
1.0000 | MEDICATED_PAD | CUTANEOUS | Status: DC | PRN
Start: 2022-04-04 — End: 2022-04-06

## 2022-04-04 MED ORDER — PRENATAL MULTIVITAMIN CH
1.0000 | ORAL_TABLET | Freq: Every day | ORAL | Status: DC
  Administered 2022-04-05 – 2022-04-06 (×2): 1 via ORAL
  Filled 2022-04-04 (×2): qty 1

## 2022-04-04 MED ORDER — DIBUCAINE (PERIANAL) 1 % EX OINT: 1.0000 | TOPICAL_OINTMENT | CUTANEOUS | Status: DC | PRN

## 2022-04-04 MED ORDER — METHIMAZOLE 10 MG PO TABS
10.0000 mg | ORAL_TABLET | Freq: Two times a day (BID) | ORAL | Status: DC
  Administered 2022-04-04 – 2022-04-06 (×4): 10 mg via ORAL
  Filled 2022-04-04 (×6): qty 1

## 2022-04-04 MED ORDER — ZOLPIDEM TARTRATE 5 MG PO TABS: 5.0000 mg | ORAL_TABLET | Freq: Every evening | ORAL | Status: DC | PRN

## 2022-04-04 MED ORDER — ONDANSETRON HCL 4 MG/2ML IJ SOLN: 4.0000 mg | INTRAMUSCULAR | Status: DC | PRN

## 2022-04-04 MED ORDER — COCONUT OIL OIL: 1.0000 | TOPICAL_OIL | Status: DC | PRN

## 2022-04-04 MED ORDER — LIDOCAINE HCL (PF) 1 % IJ SOLN
INTRAMUSCULAR | Status: DC | PRN
  Administered 2022-04-04 (×2): 4 mL via EPIDURAL

## 2022-04-04 MED ORDER — SIMETHICONE 80 MG PO CHEW: 80.0000 mg | CHEWABLE_TABLET | ORAL | Status: DC | PRN

## 2022-04-04 MED ORDER — ACETAMINOPHEN 325 MG PO TABS
650.0000 mg | ORAL_TABLET | ORAL | Status: DC | PRN
  Administered 2022-04-04: 650 mg via ORAL
  Filled 2022-04-04: qty 2

## 2022-04-04 MED ORDER — IBUPROFEN 600 MG PO TABS
600.0000 mg | ORAL_TABLET | Freq: Four times a day (QID) | ORAL | Status: DC
  Administered 2022-04-04 – 2022-04-06 (×8): 600 mg via ORAL
  Filled 2022-04-04 (×8): qty 1

## 2022-04-04 MED ORDER — SENNOSIDES-DOCUSATE SODIUM 8.6-50 MG PO TABS
2.0000 | ORAL_TABLET | Freq: Every day | ORAL | Status: DC
  Administered 2022-04-05 – 2022-04-06 (×2): 2 via ORAL
  Filled 2022-04-04 (×2): qty 2

## 2022-04-04 MED ORDER — DIPHENHYDRAMINE HCL 25 MG PO CAPS: 25.0000 mg | ORAL_CAPSULE | Freq: Four times a day (QID) | ORAL | Status: DC | PRN

## 2022-04-04 MED ORDER — TETANUS-DIPHTH-ACELL PERTUSSIS 5-2.5-18.5 LF-MCG/0.5 IM SUSY: 0.5000 mL | PREFILLED_SYRINGE | Freq: Once | INTRAMUSCULAR | Status: DC

## 2022-04-04 MED ORDER — ONDANSETRON HCL 4 MG PO TABS: 4.0000 mg | ORAL_TABLET | ORAL | Status: DC | PRN

## 2022-04-04 MED ORDER — BENZOCAINE-MENTHOL 20-0.5 % EX AERO
1.0000 | INHALATION_SPRAY | CUTANEOUS | Status: DC | PRN
  Administered 2022-04-04: 1 via TOPICAL
  Filled 2022-04-04: qty 56

## 2022-04-04 NOTE — Progress Notes (Signed)
Labor Progress Note Megan Cruz is a 21 y.o. G1P0000 at [redacted]w[redacted]d presented for IOL for hyperthyrodism. S: Doing well. No concerns. Starting to feel discomfort. Foley balloon just fell out.  O:  BP (!) 98/48   Pulse (!) 58   Temp 97.8 F (36.6 C) (Oral)   Resp 17   Ht 5' (1.524 m)   Wt 79.2 kg   LMP 06/27/2021 (Within Months)   BMI 34.12 kg/m  EFM: 135/moderate variability/+accels, no decels  Cervix: per RN check: 4cm / 60% / -2 station  Moderate contractions, every 2-4 mins.  A&P: 21 y.o. G1P0000 [redacted]w[redacted]d, IOL for hyperthyroidism. S/p cytotec X 1, cervical balloon just fell out. #Labor: start oxytocin and titrate by 2 units per protocol #Pain: epidural in place #FWB: Category 1 #GBS negative  Hyperthyroidism; seizures - stable. Has keppra ordered. Off methimazole per endocrinologist's recommendation.  Burlin Mcnair Lizabeth Leyden, MD

## 2022-04-04 NOTE — Discharge Summary (Signed)
Postpartum Discharge Summary  Date of Service updated***     Patient Name: Megan Cruz DOB: [birth year] MRN: [id number]  Date of admission: 04/03/2022 Delivery date:04/04/2022  Delivering provider: Julianne Handler  Date of discharge: 04/04/2022  Admitting diagnosis: Hyperthyroidism complicating pregnancy [O84.166, E05.90] Intrauterine pregnancy: [redacted]w[redacted]d     Secondary diagnosis:  Principal Problem:   Hyperthyroidism complicating pregnancy  Additional problems: none    Discharge diagnosis: Term Pregnancy Delivered and hyperthyroidism                                               Post partum procedures: none Augmentation: AROM, Pitocin, Cytotec, and IP Foley Complications: None  Hospital course: Induction of Labor With Vaginal Delivery   21 y.o. yo G1P0000 at [redacted]w[redacted]d was admitted to the hospital 04/03/2022 for induction of labor.  Indication for induction:  hyperthroidism .  Patient had an uncomplicated labor course as follows: Membrane Rupture Time/Date: 3:15 AM ,04/04/2022   Delivery Method:Vaginal, Spontaneous  Episiotomy: None  Lacerations:  1st degree  Details of delivery can be found in separate delivery note.  Patient had a routine postpartum course. Patient is discharged home 04/04/22.  Newborn Data: Birth date:04/04/2022  Birth time:10:32 AM  Gender:Female  Living status:Living  Apgars:9 ,9  Weight:   Magnesium Sulfate received: No BMZ received: No Rhophylac:N/A MMR:N/A T-DaP:Given postpartum Flu: N/A Transfusion:No  Physical exam  Vitals:   04/04/22 0932 04/04/22 1001 04/04/22 1031 04/04/22 1058  BP: 102/74 130/86 (!) 134/101 119/78  Pulse: 72 83 85 77  Resp:      Temp:      TempSrc:      Weight:      Height:       General: {Exam; general:21111117} Lochia: {Desc; appropriate/inappropriate:30686::"appropriate"} Uterine Fundus: {Desc; firm/soft:30687} Incision: {Exam; incision:21111123} DVT Evaluation: {Exam; dvt:2111122} Labs: Lab Results   Component Value Date   WBC 9.6 04/03/2022   HGB 8.2 (L) 04/03/2022   HCT 25.8 (L) 04/03/2022   MCV 77.0 (L) 04/03/2022   PLT 238 04/03/2022      Latest Ref Rng & Units 11/02/2021    9:56 AM  CMP  Glucose 70 - 99 mg/dL 89   BUN 6 - 23 mg/dL 5   Creatinine 0.40 - 1.20 mg/dL 0.44   Sodium 135 - 145 mEq/L 136   Potassium 3.5 - 5.1 mEq/L 4.0   Chloride 96 - 112 mEq/L 106   CO2 19 - 32 mEq/L 22   Calcium 8.4 - 10.5 mg/dL 9.4   Total Protein 6.0 - 8.3 g/dL 5.8   Total Bilirubin 0.2 - 1.2 mg/dL 0.3   Alkaline Phos 39 - 117 U/L 68   AST 0 - 37 U/L 11   ALT 0 - 35 U/L 9    Edinburgh Score:     No data to display           After visit meds:  Allergies as of 04/04/2022   No Known Allergies   Med Rec must be completed prior to using this [facility]***        Discharge home in stable condition Infant Feeding: {Baby feeding:23562} Infant Disposition:{CHL IP OB HOME WITH AYTKZS:01093} Discharge instruction: per After Visit Summary and Postpartum booklet. Activity: Advance as tolerated. Pelvic rest for 6 weeks.  Diet: {OB ATFT:73220254} Future Appointments: Future Appointments  Date Time Provider [place]  04/04/2022  3:00 PM [facility] NURSE [facility]  04/04/2022  3:15 PM [facility] NST [facility]  04/11/2022  1:30 PM Leftwich-Kirby, Kathie Dike, CNM [facility] None  04/18/2022  1:30 PM Leftwich-Kirby, Kathie Dike, CNM [facility] None  06/08/2022 11:30 AM Cameron Sprang, MD [facility] None   Follow up Visit:   Please schedule this patient for a In person postpartum visit in 4 weeks with the following provider: Any provider. Additional Postpartum F/U: none   High risk pregnancy complicated by:  hyperthyroidism Delivery mode:  Vaginal, Spontaneous  Anticipated Birth Control:  Unsure   04/04/2022 Truett Mainland, DO

## 2022-04-04 NOTE — Anesthesia Preprocedure Evaluation (Addendum)
Anesthesia Evaluation  Patient identified by MRN, date of birth, ID band Patient awake    Reviewed: Allergy & Precautions, Patient's Chart, lab work & pertinent test results  Airway Mallampati: II  TM Distance: >3 FB Neck ROM: Full    Dental no notable dental hx. (+) Teeth Intact   Pulmonary neg pulmonary ROS,    Pulmonary exam normal        Cardiovascular negative cardio ROS Normal cardiovascular exam     Neuro/Psych Seizures -, Well Controlled,  negative psych ROS   GI/Hepatic Neg liver ROS, GERD  ,  Endo/Other  Hyperthyroidism Obesity  Renal/GU negative Renal ROS  negative genitourinary   Musculoskeletal negative musculoskeletal ROS (+)   Abdominal (+) + obese,   Peds  Hematology  (+) Blood dyscrasia, anemia ,   Anesthesia Other Findings   Reproductive/Obstetrics (+) Pregnancy                             Anesthesia Physical Anesthesia Plan  ASA: 2  Anesthesia Plan: Epidural   Post-op Pain Management:    Induction:   PONV Risk Score and Plan:   Airway Management Planned: Natural Airway  Additional Equipment:   Intra-op Plan:   Post-operative Plan:   Informed Consent: I have reviewed the patients History and Physical, chart, labs and discussed the procedure including the risks, benefits and alternatives for the proposed anesthesia with the patient or authorized representative who has indicated his/her understanding and acceptance.       Plan Discussed with: Anesthesiologist  Anesthesia Plan Comments:         Anesthesia Quick Evaluation

## 2022-04-04 NOTE — Lactation Note (Signed)
This note was copied from a baby's chart. Lactation Consultation Note  Patient Name: Megan Cruz [id number] Date: 04/04/2022 Reason for consult: L&D Initial assessment;Term Age:22 hours   Initial L&D Consult:  Visited with family < 1 hour after birth Assisted to latch, however, baby not interested in initiating a suck.  She is congested and became fussy when latched.  Gentle stimulation demonstrated; no suck.  Placed her back STS on birth parent's chest and she fell asleep.  Reassured parents that lactation services will be available on the M/B unit.  Support person at bedside.   Maternal Data    Feeding Mother's Current Feeding Choice: Breast Milk  LATCH Score Latch: Too sleepy or reluctant, no latch achieved, no sucking elicited.  Audible Swallowing: None  Type of Nipple: Flat  Comfort (Breast/Nipple): Soft / non-tender  Hold (Positioning): Assistance needed to correctly position infant at breast and maintain latch.  LATCH Score: 4   Lactation Tools Discussed/Used    Interventions Interventions: Assisted with latch;Skin to skin  Discharge    Consult Status Consult Status: Follow-up from L&D    Kyndal Heringer R Jewelle Whitner 04/04/2022, 11:17 AM

## 2022-04-04 NOTE — Progress Notes (Signed)
Labor Progress Note Megan Cruz is a 21 y.o. G1P0000 at [redacted]w[redacted]d presented for IOL for hyperthyroid  S:  Comfortable with epidural but feeling pressure.  O:  BP 102/74   Pulse 72   Temp 98 F (36.7 C) (Oral)   Resp 16   Ht 5' (1.524 m)   Wt 79.2 kg   LMP 06/27/2021 (Within Months)   BMI 34.12 kg/m  EFM: baseline 150 bpm/ mod variability/ no accels/ variable decels  Toco/IUPC: 2-4 SVE: Dilation: 10 Dilation Complete Date: 04/04/22 Dilation Complete Time: 0927 Effacement (%): 90 Cervical Position: Middle Station: Plus 1 Presentation: Vertex Exam by:: lee Pitocin: 16 mu/min  A/P: 21 y.o. G1P0000 [redacted]w[redacted]d  1. Labor: active 2. FWB: Cat II 3. Pain: epidural  Will start pushing. Anticipate SVD.  Donette Larry, CNM 10:07 AM

## 2022-04-04 NOTE — Progress Notes (Addendum)
Labor Progress Note Megan Cruz is a 21 y.o. G1P0000 at [redacted]w[redacted]d presented for IOL for hyperthyrodism. S: Comfortable with epidural just placed.   O:  BP (!) 98/48   Pulse (!) 58   Temp 97.8 F (36.6 C) (Oral)   Resp 17   Ht 5' (1.524 m)   Wt 79.2 kg   LMP 06/27/2021 (Within Months)   BMI 34.12 kg/m  EFM: 135/moderate variability/+accels, no decels  CVE: Dilation: 5 Effacement (%): 70 Cervical Position: Middle Station: -2 Presentation: Vertex Exam by:: Dr. Ladon Applebaum  Moderate contractions, every 2-4 mins.  A&P: 21 y.o. G1P0000 [redacted]w[redacted]d, IOL for hyperthyroidism. S/p cytotec X 1, cerivcal balloon, now on pitocin #Labor: Progressing well. AROM performed at 0315, with clear fluid. Will continue pitocin and titrate per protocol #Pain: epidural in place #FWB: Category 1 #GBS negative  Hyperthyroidism; seizures - stable. Has keppra ordered. Off methimazole per endocrinologist's recommendation.  Shiheem Corporan Lizabeth Leyden, MD 3:51 AM

## 2022-04-04 NOTE — Lactation Note (Signed)
This note was copied from a baby's chart. Lactation Consultation Note  Patient Name: Megan Cruz [id number] Date: 04/04/2022 Reason for consult: Initial assessment;Term;Primapara;1st time breastfeeding Age:22 hours   P1: Term infant at 39+2 weeks Feeding preference: Breast  RN requested latch assistance.  Birth parent interested in having assistance with latching.  Taught hand expression and multiple drops finger fed to baby (No name yet).  Assisted to latch in the cross cradle hold and observed baby feeding for 7 minutes.  Birth parent denied pain with feeding.  Reviewed positioning and body alignment.  Birth parent very sleepy after feeding.  Suggested support person do STS while birth parent takes a nap.  Support person willing.  Placed baby on his chest and she fell asleep.  Breast feeding basics reviewed.  Birth parent may benefit from a hand pump for nipple eversion if baby does not continue to latch.  However, this was not needed at 2 hours after birth due to a good latch.  Breast tissue is very compressible.  Continue to monitor and educate on positioning.  RN updated.   Maternal Data Has patient been taught Hand Expression?: Yes Does the patient have breastfeeding experience prior to this delivery?: No  Feeding Mother's Current Feeding Choice: Breast Milk  LATCH Score Latch: Grasps breast easily, tongue down, lips flanged, rhythmical sucking.  Audible Swallowing: None  Type of Nipple: Flat  Comfort (Breast/Nipple): Soft / non-tender  Hold (Positioning): Assistance needed to correctly position infant at breast and maintain latch.  LATCH Score: 6   Lactation Tools Discussed/Used    Interventions Interventions: Breast feeding basics reviewed;Assisted with latch;Skin to skin;Breast massage;Hand express;Breast compression;Expressed milk;Position options;Support pillows;Adjust position;Education;LC Services brochure  Discharge Pump: Personal (Unsure of  brand)  Consult Status Consult Status: Follow-up Date: 04/05/22 Follow-up type: In-patient    Billyjack Trompeter R [name] 04/04/2022, 1:28 PM

## 2022-04-04 NOTE — Anesthesia Procedure Notes (Signed)
Epidural Patient location during procedure: OB Start time: 04/04/2022 1:04 AM End time: 04/04/2022 1:12 AM  Staffing Anesthesiologist: Mal Amabile, MD Performed: anesthesiologist   Preanesthetic Checklist Completed: patient identified, IV checked, site marked, risks and benefits discussed, surgical consent, monitors and equipment checked, pre-op evaluation and timeout performed  Epidural Patient position: sitting Prep: DuraPrep and site prepped and draped Patient monitoring: continuous pulse ox and blood pressure Approach: midline Location: L3-L4 Injection technique: LOR air  Needle:  Needle type: Tuohy  Needle gauge: 17 G Needle length: 9 cm and 9 Needle insertion depth: 4 cm Catheter type: closed end flexible Catheter size: 19 Gauge Catheter at skin depth: 9 cm Test dose: negative and Other  Assessment Events: blood not aspirated, injection not painful, no injection resistance, no paresthesia and negative IV test  Additional Notes Patient identified. Risks and benefits discussed including failed block, incomplete  Pain control, post dural puncture headache, nerve damage, paralysis, blood pressure Changes, nausea, vomiting, reactions to medications-both toxic and allergic and post Partum back pain. All questions were answered. Patient expressed understanding and wished to proceed. Sterile technique was used throughout procedure. Epidural site was Dressed with sterile barrier dressing. No paresthesias, signs of intravascular injection Or signs of intrathecal spread were encountered.  Patient was more comfortable after the epidural was dosed. Please see RN's note for documentation of vital signs and FHR which are stable. Reason for block:procedure for pain

## 2022-04-04 NOTE — Anesthesia Postprocedure Evaluation (Signed)
Anesthesia Post Note  Patient: Megan Cruz  Procedure(s) Performed: AN AD HOC LABOR EPIDURAL     Patient location during evaluation: Mother Baby Anesthesia Type: Epidural Level of consciousness: awake and alert Pain management: pain level controlled Vital Signs Assessment: post-procedure vital signs reviewed and stable Respiratory status: spontaneous breathing, nonlabored ventilation and respiratory function stable Cardiovascular status: stable Postop Assessment: no headache, no backache and epidural receding Anesthetic complications: no   No notable events documented.  Last Vitals:  Vitals:   04/04/22 1217 04/04/22 1302  BP: 126/74 113/78  Pulse: 74 76  Resp: 18 18  Temp: 36.7 C 37.1 C  SpO2: 99%     Last Pain:  Vitals:   04/04/22 1302  TempSrc: Oral  PainSc:    Pain Goal:                   Makel Mcmann

## 2022-04-05 LAB — CBC
HCT: 22 % — ABNORMAL LOW (ref 36.0–46.0)
Hemoglobin: 6.8 g/dL — CL (ref 12.0–15.0)
MCH: 24.4 pg — ABNORMAL LOW (ref 26.0–34.0)
MCHC: 30.9 g/dL (ref 30.0–36.0)
MCV: 78.9 fL — ABNORMAL LOW (ref 80.0–100.0)
Platelets: 188 10*3/uL (ref 150–400)
RBC: 2.79 MIL/uL — ABNORMAL LOW (ref 3.87–5.11)
RDW: 14.7 % (ref 11.5–15.5)
WBC: 13 10*3/uL — ABNORMAL HIGH (ref 4.0–10.5)
nRBC: 0.2 % (ref 0.0–0.2)

## 2022-04-05 LAB — PREPARE RBC (CROSSMATCH)

## 2022-04-05 LAB — ABO/RH: ABO/RH(D): O POS

## 2022-04-05 MED ORDER — IBUPROFEN 600 MG PO TABS: 600.0000 mg | ORAL_TABLET | Freq: Four times a day (QID) | ORAL | 0 refills | Status: DC

## 2022-04-05 MED ORDER — SENNOSIDES-DOCUSATE SODIUM 8.6-50 MG PO TABS: 2.0000 | ORAL_TABLET | Freq: Every day | ORAL | 0 refills | Status: AC

## 2022-04-05 MED ORDER — ACETAMINOPHEN 325 MG PO TABS
650.0000 mg | ORAL_TABLET | ORAL | 0 refills | Status: AC | PRN
Start: 2022-04-05 — End: 2022-05-05

## 2022-04-05 MED ORDER — SODIUM CHLORIDE 0.9% IV SOLUTION: Freq: Once | INTRAVENOUS | Status: AC

## 2022-04-05 NOTE — Progress Notes (Signed)
POSTPARTUM PROGRESS NOTE  Post Partum Day 1  Subjective:  Megan Cruz is a 21 y.o. G1P1001 s/p SVD at [redacted]w[redacted]d.  She reports she is doing well. No acute events overnight. She denies any problems with ambulating, voiding or po intake. Denies nausea or vomiting.  Pain is well controlled.  Lochia is adequate.  Objective: Blood pressure 109/75, pulse 68, temperature 98.4 F (36.9 C), temperature source Oral, resp. rate 18, height 5' (1.524 m), weight 79.2 kg, last menstrual period 06/27/2021, SpO2 100 %, unknown if currently breastfeeding.  Physical Exam:  General: alert, cooperative and no distress Chest: no respiratory distress Heart:regular rate, distal pulses intact Uterine Fundus: firm, appropriately tender DVT Evaluation: No calf swelling or tenderness Extremities: mild edema Skin: warm, dry  Recent Labs    04/03/22 1505 04/05/22 0700  HGB 8.2* 6.8*  HCT 25.8* 22.0*    Assessment/Plan: Megan Cruz is a 21 y.o. G1P1001 s/p SVD at [redacted]w[redacted]d   PPD#1 - Doing well   Routine postpartum care Contraception: none, declines Feeding: Breast Anemia: Hgb dropped to 6.8 from 8.2. 1u pRBC given. AM CBC ordered Hyperthyroid: Home methimazole 10mg  continued. TSH and T4 wnl at admission.  Dispo: Plan for discharge tomorrow.   LOS: 2 days   , DO OB Fellow  04/05/2022, 11:46 AM

## 2022-04-06 LAB — CBC WITH DIFFERENTIAL/PLATELET
Abs Immature Granulocytes: 0.1 10*3/uL — ABNORMAL HIGH (ref 0.00–0.07)
Basophils Absolute: 0.1 10*3/uL (ref 0.0–0.1)
Basophils Relative: 1 %
Eosinophils Absolute: 0.4 10*3/uL (ref 0.0–0.5)
Eosinophils Relative: 3 %
HCT: 26.6 % — ABNORMAL LOW (ref 36.0–46.0)
Hemoglobin: 8.6 g/dL — ABNORMAL LOW (ref 12.0–15.0)
Immature Granulocytes: 1 %
Lymphocytes Relative: 41 %
Lymphs Abs: 5 10*3/uL — ABNORMAL HIGH (ref 0.7–4.0)
MCH: 25.9 pg — ABNORMAL LOW (ref 26.0–34.0)
MCHC: 32.3 g/dL (ref 30.0–36.0)
MCV: 80.1 fL (ref 80.0–100.0)
Monocytes Absolute: 0.6 10*3/uL (ref 0.1–1.0)
Monocytes Relative: 5 %
Neutro Abs: 5.9 10*3/uL (ref 1.7–7.7)
Neutrophils Relative %: 49 %
Platelets: 214 10*3/uL (ref 150–400)
RBC: 3.32 MIL/uL — ABNORMAL LOW (ref 3.87–5.11)
RDW: 15.4 % (ref 11.5–15.5)
WBC: 12.1 10*3/uL — ABNORMAL HIGH (ref 4.0–10.5)
nRBC: 0.3 % — ABNORMAL HIGH (ref 0.0–0.2)

## 2022-04-06 LAB — TYPE AND SCREEN
ABO/RH(D): O POS
Antibody Screen: NEGATIVE
Unit division: 0

## 2022-04-06 LAB — BPAM RBC
Blood Product Expiration Date: 202309142359
ISSUE DATE / TIME: 202308151123
Unit Type and Rh: 5100

## 2022-04-06 MED ORDER — FERROUS SULFATE 325 (65 FE) MG PO TABS: 325.0000 mg | ORAL_TABLET | ORAL | 0 refills | Status: DC

## 2022-04-06 NOTE — Discharge Summary (Signed)
Postpartum Discharge Summary  Date of Service updated 04/06/22     Patient Name: Megan Cruz DOB: 2001/07/23 MRN: [id number]  Date of admission: 04/03/2022 Delivery date:04/04/2022  Delivering provider: Julianne Handler  Date of discharge: 04/06/2022  Admitting diagnosis: Hyperthyroidism complicating pregnancy [Q65.784, E05.90] Intrauterine pregnancy: [redacted]w[redacted]d    Secondary diagnosis:  Principal Problem:   Hyperthyroidism complicating pregnancy  Additional problems: symptomatic anemia requiring 1u PRBC, hyperthyroidism    Discharge diagnosis: Term Pregnancy Delivered and Anemia                                             Post partum procedures:blood transfusion Augmentation: AROM, Pitocin, Cytotec, and IP Foley Complications: [facility] With Vaginal Delivery   21y.o. yo G1P1001 at 22w2das admitted to the hospital 04/03/2022 for induction of labor.  Indication for induction:  hyperthyroidism .  Patient had an uncomplicated labor course as follows: Membrane Rupture Time/Date: 3:15 AM ,04/04/2022   Delivery Method:Vaginal, Spontaneous  Episiotomy: None  Lacerations:  1st degree  Details of delivery can be found in separate delivery note.  Patient had a routine postpartum course. Patient is discharged home 04/06/22.  Newborn Data: Birth date:04/04/2022  Birth time:10:32 AM  Gender:Female  Living status:Living  Apgars:9 ,9  Weight:2892 g   Magnesium Sulfate received: No BMZ received: No Rhophylac:N/A MMR:N/A T-DaP: offer postpartum Flu: N/A Transfusion:Yes, 1u PRBC  Physical exam  Vitals:   04/05/22 1145 04/05/22 1423 04/05/22 2020 04/06/22 0517  BP: 109/73 106/67 (!) 120/59 119/70  Pulse: 74 65 62 70  Resp: _0 Temp: 98.7 F (37.1 C) 98.5 F (36.9 C)  97.8 F (36.6 C)  TempSrc: Oral Oral  Oral  SpO2: 100% 100%  100%  Weight:      Height:       General: alert, cooperative, and no  distress Lochia: appropriate Uterine Fundus: firm Incision: N/A DVT Evaluation: No evidence of DVT seen on physical exam. Negative Homan's sign. No cords or calf tenderness. No significant calf/ankle edema. Labs: Lab Results  Component Value Date   WBC 12.1 (H) 04/06/2022   HGB 8.6 (L) 04/06/2022   HCT 26.6 (L) 04/06/2022   MCV 80.1 04/06/2022   PLT 214 04/06/2022      Latest Ref Rng & Units 11/02/2021    9:56 AM  CMP  Glucose 70 - 99 mg/dL 89   BUN 6 - 23 mg/dL 5   Creatinine 0.40 - 1.20 mg/dL 0.44   Sodium 135 - 145 mEq/L 136   Potassium 3.5 - 5.1 mEq/L 4.0   Chloride 96 - 112 mEq/L 106   CO2 19 - 32 mEq/L 22   Calcium 8.4 - 10.5 mg/dL 9.4   Total Protein 6.0 - 8.3 g/dL 5.8   Total Bilirubin 0.2 - 1.2 mg/dL 0.3   Alkaline Phos 39 - 117 U/L 68   AST 0 - 37 U/L 11   ALT 0 - 35 U/L 9    Edinburgh Score:    04/04/2022   12:18 PM  Edinburgh Postnatal Depression Scale Screening Tool  I have been able to laugh and see the funny side of things. 0  I have looked forward with enjoyment to things. 0  I have blamed myself unnecessarily when things went wrong. 0  I have been anxious  or worried for no good reason. 0  I have felt scared or panicky for no good reason. 0  Things have been getting on top of me. 0  I have been so unhappy that I have had difficulty sleeping. 0  I have felt sad or miserable. 0  I have been so unhappy that I have been crying. 0  The thought of harming myself has occurred to me. 0  Edinburgh Postnatal Depression Scale Total 0     After visit meds:  Allergies as of 04/06/2022   No Known Allergies      Medication List     TAKE these medications    acetaminophen 325 MG tablet Commonly known as: Tylenol Take 2 tablets (650 mg total) by mouth every 4 (four) hours as needed (for pain scale < 4).   Blood Pressure Monitor Kit Use blood pressure monitor to check BP weekly   ferrous sulfate 325 (65 FE) MG tablet Take 1 tablet (325 mg total) by  mouth every other day.   Gojji Weight Scale Misc Use scale to check weight once weekly   ibuprofen 600 MG tablet Commonly known as: ADVIL Take 1 tablet (600 mg total) by mouth every 6 (six) hours.   levETIRAcetam 500 MG 24 hr tablet Commonly known as: KEPPRA XR Take 3 tablets twice a day What changed:  how much to take how to take this when to take this additional instructions   methimazole 5 MG tablet Commonly known as: TAPAZOLE Take 2 tablets (10 mg total) by mouth 2 (two) times daily.   prenatal multivitamin Tabs tablet Take 1 tablet by mouth daily at 12 noon.   senna-docusate 8.6-50 MG tablet Commonly known as: Senokot-S Take 2 tablets by mouth at bedtime.         Discharge home in stable condition Infant Feeding: Breast Infant Disposition:home with mother Discharge instruction: per After Visit Summary and Postpartum booklet. Activity: Advance as tolerated. Pelvic rest for 6 weeks.  Diet: routine diet Future Appointments: Future Appointments  Date Time Provider [place]  05/25/2022 10:55 AM Chancy Milroy, MD [place] None  06/08/2022 11:30 AM Cameron Sprang, MD [facility] None   Follow up Visit:  [place]. Schedule an appointment as soon as possible for a visit.   Why: 4-6 weeks for your postpartum visit Contact information: [place] [address] [phone]              Message sent to [facility] on 04/05/22 by Shelda Pal, DO   Please schedule this patient for a In person postpartum visit in 4 weeks with the following provider: Any provider. Additional Postpartum F/U: Recheck CBC at [facility] visit  High risk pregnancy complicated by:  hyperthyroidism Delivery mode:  Vaginal, Spontaneous  Anticipated Birth Control:   declines  04/06/2022 Roma Schanz, CNM

## 2022-04-06 NOTE — Discharge Instructions (Signed)
NO SEX UNTIL AFTER YOU GET YOUR BIRTH CONTROL  

## 2022-04-06 NOTE — Progress Notes (Signed)
Morning labs were not transferring to Epic so records were faxed to RN. This mornings labs below. Paper labs placed in chart.   hgb is 8.6   Platelets 214 WBC 12.1 Royston Cowper, RN

## 2022-04-11 ENCOUNTER — Encounter: Payer: Medicaid Other | Admitting: Advanced Practice Midwife

## 2022-04-14 ENCOUNTER — Telehealth (HOSPITAL_COMMUNITY): Payer: Self-pay | Admitting: *Deleted

## 2022-04-14 NOTE — Telephone Encounter (Signed)
Mom reports feeling good. No concerns about herself at this time. EPDS=0 [facility] score=0) Mom reports baby is doing well. Feeding, peeing, and pooping without difficulty. Safe sleep reviewed. Mom reports no concerns about baby at present.  Duffy Rhody, RN 04-14-2022 at 3:16pm

## 2022-04-14 NOTE — Progress Notes (Signed)
Pt had pre-existing anemia of pregnancy and a normal blood loss with her delivery. Her pre-existing anemia of pregnancy was most likely due to iron deficiency anemia.   Milas Hock, MD Attending Obstetrician & Gynecologist, [facility] for [facility], [facility]

## 2022-04-18 ENCOUNTER — Encounter: Payer: Medicaid Other | Admitting: Advanced Practice Midwife

## 2022-05-25 ENCOUNTER — Encounter: Payer: Self-pay | Admitting: Obstetrics and Gynecology

## 2022-05-25 ENCOUNTER — Ambulatory Visit (INDEPENDENT_AMBULATORY_CARE_PROVIDER_SITE_OTHER): Payer: Medicaid Other | Admitting: Obstetrics and Gynecology

## 2022-05-25 VITALS — BP 99/66 | HR 66 | Ht 60.0 in | Wt 145.0 lb

## 2022-05-25 DIAGNOSIS — Z3042 Encounter for surveillance of injectable contraceptive: Secondary | ICD-10-CM

## 2022-05-25 DIAGNOSIS — E0590 Thyrotoxicosis, unspecified without thyrotoxic crisis or storm: Secondary | ICD-10-CM | POA: Diagnosis not present

## 2022-05-25 DIAGNOSIS — R87612 Low grade squamous intraepithelial lesion on cytologic smear of cervix (LGSIL): Secondary | ICD-10-CM | POA: Diagnosis not present

## 2022-05-25 DIAGNOSIS — Z309 Encounter for contraceptive management, unspecified: Secondary | ICD-10-CM | POA: Insufficient documentation

## 2022-05-25 DIAGNOSIS — Z30016 Encounter for initial prescription of transdermal patch hormonal contraceptive device: Secondary | ICD-10-CM

## 2022-05-25 MED ORDER — XULANE 150-35 MCG/24HR TD PTWK: 1.0000 | MEDICATED_PATCH | TRANSDERMAL | 12 refills | Status: DC

## 2022-05-25 NOTE — Patient Instructions (Signed)

## 2022-05-25 NOTE — Progress Notes (Signed)
[place] Partum Visit Note  Megan Cruz is a 21 y.o. G69P1001 female who presents for a postpartum visit. She is 7 weeks postpartum following a normal spontaneous vaginal delivery.  I have fully reviewed the prenatal and intrapartum course. The delivery was at 39.2 gestational weeks.  Anesthesia: epidural. Postpartum course has been unremarkable. Baby is doing well. Baby is feeding by bottle - Gerber Gentle . Bleeding no bleeding. Bowel function is normal. Bladder function is normal. Patient is sexually active, last unprotected sex was 3 days ago. Contraception method is none. Postpartum depression screening: negative.   Upstream - 05/25/22 1108       Pregnancy Intention Screening   Does the patient want to become pregnant in the next year? No    Does the patient's partner want to become pregnant in the next year? No    Would the patient like to discuss contraceptive options today? Yes            The pregnancy intention screening data noted above was reviewed. Potential methods of contraception were discussed. The patient elected to proceed with No data recorded.   Edinburgh Postnatal Depression Scale - 05/25/22 1109       Edinburgh Postnatal Depression Scale:  In the Past 7 Days   I have been able to laugh and see the funny side of things. 0    I have looked forward with enjoyment to things. 0    I have blamed myself unnecessarily when things went wrong. 0    I have been anxious or worried for no good reason. 0    I have felt scared or panicky for no good reason. 0    Things have been getting on top of me. 0    I have been so unhappy that I have had difficulty sleeping. 0    I have felt sad or miserable. 0    I have been so unhappy that I have been crying. 0    The thought of harming myself has occurred to me. 0    Edinburgh Postnatal Depression Scale Total 0             Health Maintenance Due  Topic Date Due   COVID-19 Vaccine (1) Never done   HPV VACCINES (1 -  2-dose series) Never done   INFLUENZA VACCINE  03/22/2022    Medical record  Review of Systems Pertinent items noted in HPI and remainder of comprehensive ROS otherwise negative.  Objective:  BP 99/66   Pulse 66   Ht 5' (1.524 m)   Wt 145 lb (65.8 kg)   LMP 06/27/2021 (Within Months)   Breastfeeding No   BMI 28.32 kg/m    General:  alert   Breasts:  not indicated  Lungs: clear to auscultation bilaterally  Heart:  regular rate and rhythm, S1, S2 normal, no murmur, click, rub or gallop  Abdomen: soft, non-tender; bowel sounds normal; no masses,  no organomegaly   Wound NA  GU exam:  not indicated       Assessment:    There are no diagnoses linked to this encounter.  Nl postpartum exam.  Contraception management HYperthyroidism  Plan:   Essential components of care per ACOG recommendations:  1.  Mood and well being: Patient with negative depression screening today. Reviewed local resources for support.  - Patient tobacco use? No.   - hx of drug use? No.    2. Infant care and feeding:  -Patient currently breastmilk feeding?  NO -Social determinants of health (SDOH) reviewed in EPIC. No concerns  3. Sexuality, contraception and birth spacing - Patient does not want a pregnancy in the next year.  Desired family size is uncertain  - Reviewed reproductive life planning. Reviewed contraceptive methods based on pt preferences and effectiveness.  Patient desired Contraceptive Patch today.  Pt had intercourse 3 days ago. Advised to reframe from IC x 2 weeks and take home pregnancy test, if negative start patch or if has cycle to start patch Sunday after cycle. U/R/B and back up method reviewed with pt - Discussed birth spacing of 18 months  4. Sleep and fatigue -Encouraged family/partner/community support of 4 hrs of uninterrupted sleep to help with mood and fatigue  5. Physical Recovery  - Discussed patients delivery and complications. She describes her labor as good. -  Patient had a Vaginal, no problems at delivery.  - Patient has urinary incontinence? No. - Patient is safe to resume physical and sexual activity  6.  Health Maintenance - HM due items addressed Yes - Last pap smear  Diagnosis  Date Value Ref Range Status  10/26/2021 - Low grade squamous intraepithelial lesion (LSIL) (A)  Final   Pap smear not done at today's visit.  -Breast Cancer screening indicated? No.   7. Chronic Disease/Pregnancy Condition follow up: Hyperthyroidism  - PCP follow up  F/U in March 2024 for repeat pap smear  Nettie Elm, MD Center for [facility], [facility]

## 2022-06-08 ENCOUNTER — Encounter: Payer: Self-pay | Admitting: Neurology

## 2022-06-08 ENCOUNTER — Ambulatory Visit: Payer: Medicaid Other | Admitting: Neurology

## 2022-06-08 DIAGNOSIS — Z029 Encounter for administrative examinations, unspecified: Secondary | ICD-10-CM

## 2022-08-13 ENCOUNTER — Emergency Department (HOSPITAL_COMMUNITY)
Admission: EM | Admit: 2022-08-13 | Discharge: 2022-08-13 | Disposition: A | Discharge status: Home / Self Care | Payer: Medicaid Other | Attending: Emergency Medicine | Admitting: Emergency Medicine

## 2022-08-13 ENCOUNTER — Encounter (HOSPITAL_COMMUNITY): Payer: Self-pay | Admitting: Oncology

## 2022-08-13 ENCOUNTER — Other Ambulatory Visit: Payer: Self-pay

## 2022-08-13 DIAGNOSIS — R569 Unspecified convulsions: Secondary | ICD-10-CM | POA: Diagnosis not present

## 2022-08-13 LAB — CBC WITH DIFFERENTIAL/PLATELET
Abs Immature Granulocytes: 0.02 10*3/uL (ref 0.00–0.07)
Basophils Absolute: 0 10*3/uL (ref 0.0–0.1)
Basophils Relative: 0 %
Eosinophils Absolute: 0.6 10*3/uL — ABNORMAL HIGH (ref 0.0–0.5)
Eosinophils Relative: 7 %
HCT: 33.3 % — ABNORMAL LOW (ref 36.0–46.0)
Hemoglobin: 10.7 g/dL — ABNORMAL LOW (ref 12.0–15.0)
Immature Granulocytes: 0 %
Lymphocytes Relative: 48 %
Lymphs Abs: 4.2 10*3/uL — ABNORMAL HIGH (ref 0.7–4.0)
MCH: 26.4 pg (ref 26.0–34.0)
MCHC: 32.1 g/dL (ref 30.0–36.0)
MCV: 82.2 fL (ref 80.0–100.0)
Monocytes Absolute: 0.4 10*3/uL (ref 0.1–1.0)
Monocytes Relative: 5 %
Neutro Abs: 3.4 10*3/uL (ref 1.7–7.7)
Neutrophils Relative %: 40 %
Platelets: 477 10*3/uL — ABNORMAL HIGH (ref 150–400)
RBC: 4.05 MIL/uL (ref 3.87–5.11)
RDW: 14.6 % (ref 11.5–15.5)
WBC: 8.7 10*3/uL (ref 4.0–10.5)
nRBC: 0 % (ref 0.0–0.2)

## 2022-08-13 LAB — BASIC METABOLIC PANEL
Anion gap: 6 (ref 5–15)
BUN: 8 mg/dL (ref 6–20)
CO2: 20 mmol/L — ABNORMAL LOW (ref 22–32)
Calcium: 8.7 mg/dL — ABNORMAL LOW (ref 8.9–10.3)
Chloride: 112 mmol/L — ABNORMAL HIGH (ref 98–111)
Creatinine, Ser: 0.88 mg/dL (ref 0.44–1.00)
GFR, Estimated: >60 mL/min (ref >60)
Glucose, Bld: 109 mg/dL — ABNORMAL HIGH (ref 70–99)
Potassium: 3.7 mmol/L (ref 3.5–5.1)
Sodium: 138 mmol/L (ref 135–145)

## 2022-08-13 LAB — I-STAT BETA HCG BLOOD, ED (MC, WL, AP ONLY): I-stat hCG, quantitative: <5 m[IU]/mL (ref <5)

## 2022-08-13 LAB — CBG MONITORING, ED: Glucose-Capillary: 118 mg/dL — ABNORMAL HIGH (ref 70–99)

## 2022-08-13 NOTE — ED Provider Notes (Signed)
[facility]-EMERGENCY DEPT Provider Note   CSN: [id number] Arrival date & time: 08/13/22  1449     History  Chief Complaint  Patient presents with   Seizures    Megan Cruz is a 21 y.o. female.  21 yo F with a chief complaints of seizure-like activity.  Patient does not know exactly what happened.  She thinks that she went to work and then came home and then maybe she took shower and that is the last thing she remembers.  She tells me that she is on Keppra for seizures.  She thinks she is taking it every day without any missed doses.  She denies being sick denies cough congestion or fever denies abdominal pain denies nausea vomiting or diarrhea.  She has some neck pain and some calf pain after the event.        Home Medications Prior to Admission medications   Medication Sig Start Date End Date Taking? Authorizing Provider  ferrous sulfate 325 (65 FE) MG tablet Take 1 tablet (325 mg total) by mouth every other day. 04/06/22 05/06/22  Ilda Basset, MD  levETIRAcetam (KEPPRA XR) 500 MG 24 hr tablet Take 3 tablets twice a day Patient taking differently: Take 1,000 mg by mouth in the morning and at bedtime. 01/14/22   Van Clines, MD  methimazole (TAPAZOLE) 5 MG tablet Take 2 tablets (10 mg total) by mouth 2 (two) times daily. Patient not taking: Reported on 04/04/2022 11/16/21   Shamleffer, Konrad Dolores, MD  norelgestromin-ethinyl estradiol [facility]r [facility]ico) 150-35 MCG/24HR transdermal patch Place 1 patch onto the skin once a week. 05/25/22   Hermina Staggers, MD  Prenatal Vit-Fe Fumarate-FA (PRENATAL MULTIVITAMIN) TABS tablet Take 1 tablet by mouth daily at 12 noon.    [provider]      Allergies    Patient has no known allergies.    Review of Systems   Review of Systems  Physical Exam Updated Vital Signs BP 113/78 (BP Location: Right Arm)   Pulse (!) 101   Temp 98.4 F (36.9 C) (Oral)   Resp 15   Ht 5' (1.524 m)   Wt 68 kg   LMP  07/26/2022 (Approximate)   SpO2 100%   BMI 29.29 kg/m  Physical Exam Vitals and nursing note reviewed.  Constitutional:      General: She is not in acute distress.    Appearance: She is well-developed. She is not diaphoretic.  HENT:     Head: Normocephalic and atraumatic.  Eyes:     Pupils: Pupils are equal, round, and reactive to light.  Cardiovascular:     Rate and Rhythm: Normal rate and regular rhythm.     Heart sounds: No murmur heard.    No friction rub. No gallop.  Pulmonary:     Effort: Pulmonary effort is normal.     Breath sounds: No wheezing or rales.  Abdominal:     General: There is no distension.     Palpations: Abdomen is soft.     Tenderness: There is no abdominal tenderness.  Musculoskeletal:        General: No tenderness.     Cervical back: Normal range of motion and neck supple.  Skin:    General: Skin is warm and dry.  Neurological:     Mental Status: She is alert and oriented to person, place, and time.  Psychiatric:        Behavior: Behavior normal.     ED Results / Procedures /  Treatments   Labs (all labs ordered are listed, but only abnormal results are displayed) Labs Reviewed  CBC WITH DIFFERENTIAL/PLATELET - Abnormal; Notable for the following components:      Result Value   Hemoglobin 10.7 (*)    HCT 33.3 (*)    Platelets 477 (*)    Lymphs Abs 4.2 (*)    Eosinophils Absolute 0.6 (*)    All other components within normal limits  BASIC METABOLIC PANEL - Abnormal; Notable for the following components:   Chloride 112 (*)    CO2 20 (*)    Glucose, Bld 109 (*)    Calcium 8.7 (*)    All other components within normal limits  CBG MONITORING, ED - Abnormal; Notable for the following components:   Glucose-Capillary 118 (*)    All other components within normal limits  LEVETIRACETAM LEVEL  I-STAT BETA HCG BLOOD, ED ([facility], [facility], [facility] ONLY)    EKG EKG Interpretation  Date/Time:  Saturday August 13 2022 14:58:06 EST Ventricular Rate:  110 PR  Interval:  172 QRS Duration: 71 QT Interval:  325 QTC Calculation: 440 R Axis:   69 Text Interpretation: Sinus tachycardia Low voltage, precordial leads No significant change since last tracing Confirmed by Melene Plan [phone]) on 08/13/2022 3:00:37 PM  Radiology No results found.  Procedures Procedures    Medications Ordered in ED Medications - No data to display  ED Course/ Medical Decision Making/ A&P                           Medical Decision Making Amount and/or Complexity of Data Reviewed Labs: ordered. ECG/medicine tests: ordered.   21 yo F with a chief complaints of a possible seizure event.  She has been seen as an outpatient for this.  Had 2 events in the past.  Is currently on Keppra.  Had an MRI that was negative.  She is little bit sleepy but mostly back to baseline.  Will obtain basic blood work here.  Pregnancy test.  Blood sugar.  EKG.  Keppra level.  Patient is not pregnant.  No significant electrolyte abnormality.  No anemia.  EKG without concerning finding.  She continues to be well.  Family has arrived with further history.  States that she got up from a nap and told them that she was going to go to the bathroom she had 1 episode where she shook forcefully and then ended up collapsing to the ground and having with sounds like tonic-clonic like activity.  Lasted for about 5 minutes.  No loss of bowel or bladder no biting of the tongue.  Patient was quite confused upon awakening.  4:19 PM:  I have discussed the diagnosis/risks/treatment options with the patient and family.  Evaluation and diagnostic testing in the emergency department does not suggest an emergent condition requiring admission or immediate intervention beyond what has been performed at this time.  They will follow up with Neuro. We also discussed returning to the ED immediately if new or worsening sx occur. We discussed the sx which are most concerning (e.g., sudden worsening pain, fever, inability to  tolerate by mouth) that necessitate immediate return. Medications administered to the patient during their visit and any new prescriptions provided to the patient are listed below.  Medications given during this visit Medications - No data to display   The patient appears reasonably screen and/or stabilized for discharge and I doubt any other medical condition or other [facility] requiring  further screening, evaluation, or treatment in the ED at this time prior to discharge.          Final Clinical Impression(s) / ED Diagnoses Final diagnoses:  Seizure [facility])    Rx / DC Orders ED Discharge Orders     None         Melene Plan, DO 08/13/22 1619

## 2022-08-13 NOTE — ED Triage Notes (Signed)
Pt bib [facility] s/p witnessed seizure. Pt has had one seizure in the past and is followed by neurology. Pt reports being compliant w/ her Keppra.

## 2022-08-13 NOTE — Discharge Instructions (Signed)
No driving a car climbing to tall heights or swimming by yourself for the next 6 months.  Please follow-up with your neurologist in the office.

## 2022-08-16 LAB — LEVETIRACETAM LEVEL: Levetiracetam Lvl: <2 ug/mL — ABNORMAL LOW (ref 10.0–40.0)

## 2022-10-24 ENCOUNTER — Encounter: Payer: Self-pay | Admitting: Internal Medicine

## 2022-10-24 ENCOUNTER — Ambulatory Visit (INDEPENDENT_AMBULATORY_CARE_PROVIDER_SITE_OTHER): Payer: Medicaid Other | Admitting: Internal Medicine

## 2022-10-24 VITALS — BP 110/66 | HR 80 | Ht 60.0 in | Wt 148.0 lb

## 2022-10-24 DIAGNOSIS — E0500 Thyrotoxicosis with diffuse goiter without thyrotoxic crisis or storm: Secondary | ICD-10-CM | POA: Diagnosis not present

## 2022-10-24 DIAGNOSIS — E0590 Thyrotoxicosis, unspecified without thyrotoxic crisis or storm: Secondary | ICD-10-CM | POA: Diagnosis not present

## 2022-10-24 LAB — TSH: TSH: 0.57 u[IU]/mL (ref 0.35–5.50)

## 2022-10-24 LAB — T4, FREE: Free T4: 0.7 ng/dL (ref 0.60–1.60)

## 2022-10-24 NOTE — Progress Notes (Unsigned)
Name: Megan Cruz  MRN/ DOB: [id number], [birth year]    Age/ Sex: 22 y.o., female    PCP: Angeline Slim, MD   Reason for Endocrinology Evaluation: Hyperthyroidism     Date of Initial Endocrinology Evaluation: 07/27/2021    HPI: [name] is a 22 y.o. female with a past medical history of hyperthyroidism and seizure disorder.  The patient presented for initial endocrinology clinic visit on 07/27/2021  for consultative assistance with her Hyperthyroidism.   She was diagnosed with hyperthyroidism during evaluation of tachycardia on 07/21/2021 with a suppressed TSH <0.01 uIU/mL and elevated FT4 4.02 ng/dL and T3 at 425 ng/dL . She was started on Methimazole at the time.    Prior to starting methimazole she  did not notice any weight loss, local neck symptoms but noted hand tremors . No palpitations except the day she presented to the ED   On her initial visit to  our clinic she was on Methimazole but we switched to PTU shortly after pregnancy diagnosis in 07/2021  TRAB elevated at 6.37 IU/L Sister with hypothyroidism   Patient was lost to follow-up after March 2023  She is S/P delivery 03/2022- baby girl   Upon her return to our clinic 10/2022 she has been off methimazole since 8/ 2023, with normal TFTs   SUBJECTIVE:    Today (10/24/22):  [name] is here for a follow up for hyperthyroidism .  She has NOT been to our clinic in 12 months.   She is not breast feeding  She has been off methimazole  a week prior to delivery in 03/2022 She is on COC's  Denies local neck swelling  Denies palpitations  Has noted hand tremors  Denies constipation or diarrhea     HISTORY:  Past Medical History:  Past Medical History:  Diagnosis Date   Epilepsia    Hyperthyroidism    Hyperthyroidism complicating pregnancy [id number]   Past Surgical History:  Past Surgical History:  Procedure Laterality Date   APPENDECTOMY     CYST REMOVAL PEDIATRIC Right 07/22/2013    Procedure: CYST REMOVAL PEDIATRIC;  Surgeon: Jerilynn Mages. Gerald Stabs, MD;  Location: [place];  Service: Pediatrics;  Laterality: Right;   LAPAROSCOPIC APPENDECTOMY N/A 07/22/2013   Procedure: APPENDECTOMY LAPAROSCOPIC;  Surgeon: Jerilynn Mages. Gerald Stabs, MD;  Location: [facility];  Service: Pediatrics;  Laterality: N/A;    Social History:  reports that she has never smoked. She has been exposed to tobacco smoke. She has never used smokeless tobacco. She reports that she does not drink alcohol and does not use drugs. Family History: family history includes Migraines in her sister.   HOME MEDICATIONS: Allergies as of 10/24/2022   No Known Allergies      Medication List        Accurate as of October 24, 2022  1:38 PM. If you have any questions, ask your nurse or doctor.          STOP taking these medications    prenatal multivitamin Tabs tablet Stopped by: Dorita Sciara, MD       TAKE these medications    ferrous sulfate 325 (65 FE) MG tablet Take 1 tablet (325 mg total) by mouth every other day.   levETIRAcetam 500 MG 24 hr tablet Commonly known as: KEPPRA XR Take 3 tablets twice a day What changed:  how much to take how to take this when to take this additional instructions   methimazole 5 MG tablet Commonly known  as: TAPAZOLE Take 2 tablets (10 mg total) by mouth 2 (two) times daily.   Xulane 150-35 MCG/24HR transdermal patch Generic drug: norelgestromin-ethinyl estradiol Place 1 patch onto the skin once a week.          REVIEW OF SYSTEMS: A comprehensive ROS was conducted with the patient and is negative except as per HPI     OBJECTIVE:  VS: BP 110/66 (BP Location: Left Arm, Patient Position: Sitting, Cuff Size: Small)   Pulse 80   Ht 5' (1.524 m)   Wt 148 lb (67.1 kg)   SpO2 99%   BMI 28.90 kg/m    Wt Readings from Last 3 Encounters:  10/24/22 148 lb (67.1 kg)  08/13/22 150 lb (68 kg)  05/25/22 145 lb (65.8 kg)     EXAM: General: Pt appears well and  is in NAD  Hydration: Well-hydrated with moist mucous membranes and good skin turgor  Eyes: External eye exam normal without stare, lid lag or exophthalmos.  EOM intact.  PERRL.  Neck: General: Supple without adenopathy. Thyroid: Thyroid size normal.  No goiter or nodules appreciated  Lungs: Clear with good BS bilat with no rales, rhonchi, or wheezes  Heart: Auscultation: Tachycardic  Abdomen: Normoactive bowel sounds, soft, nontender, without masses or organomegaly palpable  Extremities:  BL LE: No pretibial edema normal ROM and strength.  Mental Status: Judgment, insight: Intact Orientation: Oriented to time, place, and person Mood and affect: No depression, anxiety, or agitation     DATA REVIEWED:   Latest Reference Range & Units 10/24/22 13:49  TSH 0.35 - 5.50 uIU/mL 0.57  Triiodothyronine (T3) 76 - 181 ng/dL 172  T4,Free(Direct) 0.60 - 1.60 ng/dL 0.70     Latest Reference Range & Units Most Recent  TRAB <=2.00 IU/L 6.37 (H) 07/27/21 10:44    ASSESSMENT/PLAN/RECOMMENDATIONS:   Hyperthyroidism:  - Pt is clinically hyperthyroid - NO local neck symptoms  -Repeat TFTs are normal, patient is in remission and has not been on methimazole since 03/2022 -She will remain off antibiotic therapy at this time  2. Graves' Disease:   - No extra thyroidal manifestations of Graves' disease   F/U in 6 months   Signed electronically by: Mack Guise, MD  [facility] End[facility]  [facility] [address] [place], [place] [address] Phone: [phone] FAX: [phone]   CC: Angeline Slim, MD [address] [address] Phone: [phone] Fax: [phone]   Return to Endocrinology clinic as below: No future appointments.

## 2022-10-25 LAB — T3: T3, Total: 172 ng/dL (ref 76–181)

## 2023-03-12 ENCOUNTER — Other Ambulatory Visit: Payer: Self-pay | Admitting: Obstetrics and Gynecology

## 2023-03-12 DIAGNOSIS — Z30016 Encounter for initial prescription of transdermal patch hormonal contraceptive device: Secondary | ICD-10-CM

## 2023-04-06 ENCOUNTER — Telehealth: Payer: Self-pay | Admitting: Neurology

## 2023-04-06 MED ORDER — LEVETIRACETAM ER 500 MG PO TB24: ORAL_TABLET | ORAL | 0 refills | Status: DC

## 2023-04-06 NOTE — Addendum Note (Signed)
Addended by: Dimas Chyle on: 04/06/2023 03:14 PM   Modules accepted: Orders

## 2023-04-06 NOTE — Telephone Encounter (Signed)
Pt is calling in to make an appointment to get a refill on her medication and has not been seen since 11/12/2021.  Was not able to make an upcoming appt before next year.  Pt would like to have a call to let her know when she can get an appt this year.

## 2023-04-06 NOTE — Telephone Encounter (Signed)
Annabelle Harman, ok for work-in slot this year. Heather, ok to send refills until then, thanks

## 2023-04-06 NOTE — Telephone Encounter (Signed)
Refills sent in for pt to last until her appointment

## 2023-05-03 ENCOUNTER — Other Ambulatory Visit: Payer: Self-pay | Admitting: Neurology

## 2023-05-03 ENCOUNTER — Encounter: Payer: Self-pay | Admitting: Neurology

## 2023-05-03 ENCOUNTER — Ambulatory Visit (INDEPENDENT_AMBULATORY_CARE_PROVIDER_SITE_OTHER): Payer: Medicaid Other | Admitting: Neurology

## 2023-05-03 VITALS — BP 110/73 | HR 77 | Ht 60.0 in | Wt 151.2 lb

## 2023-05-03 DIAGNOSIS — G40B09 Juvenile myoclonic epilepsy, not intractable, without status epilepticus: Secondary | ICD-10-CM

## 2023-05-03 MED ORDER — LEVETIRACETAM ER 500 MG PO TB24: ORAL_TABLET | ORAL | 3 refills | Status: DC

## 2023-05-03 NOTE — Progress Notes (Signed)
NEUROLOGY FOLLOW UP OFFICE NOTE  Megan Cruz [id number] 2000-11-09  HISTORY OF PRESENT ILLNESS: I had the pleasure of seeing Megan Cruz in follow-up in the neurology clinic on 05/03/2023.  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 She is alone in the office today. Records and images were personally reviewed where available.  She was lost to follow-up and since last visit (when she was pregnant), her daughter is now a year old. There is an ER visit for seizure on 08/13/22, she got up from a nap and told family she was going to the bathroom then shook forcefully and ended up collapsing to the ground with tonic-clonic activity for about 5 minutes. No tongue bite or incontinence, she was confused after. She denied missing medication however her Keppra level was <2.0. She reports another seizure in August 2024, she was awake, no prior warning. She woke up on the ground with a tongue bite. She states she takes the Keppra when she remembers it. She is on Keppra XR 500mg  2 tabs BID without side effects. Since the recent seizure, she states she has been taking medication regularly. She denies any staring/unresponsive episodes, gaps in time, olfactory/gustatory hallucinations, focal numbness/tingling/weakness, myoclonic jerks. No headaches, dizziness, vision changes, no other falls. She gets 5-6 hours of sleep. Mood is good. She lives with her mother and daughter. She works at [employer].    History on Initial Assessment 08/02/2021: This is a 22 year old right-handed woman with a history of newly diagnosed hyperthyroidism presenting for evaluation of seizures. She was evaluated by pediatric neurologist Dr. Devonne Doughty in 01/2018 after an episode of eye rolling, confusion, and brief jerking when her mother woke her up in the middle of the night to take care of a baby. She remembered jerking and was able to go back to sleep. She noted occasional brief jerking in her arms when  she wakes up in the morning. EEG at that time was normal. She did well for several years until 07/20/21 when she was brought to the ER for seizure-like activity. There was a family argument and she recalled going to her bedroom where her mother found her having generalized shaking for 1-2 minutes. She denies any prior warning symptoms. She woke up in her bed and found she bit her tongue and both legs were weak. She admitted to taking a THC gummy prior to the event, which she has had in the past with no issues. She was tachycardic in the 160s-170s, given IV fluids.  Bloodwork showed normal CBC, BMP. Her TSH was very low at <0.010, free T4 was 4.02, T3 425, she was started on methimazole. She was seen by Endocrinology and started on atenolol. She lives with her mother. She denies any staring/unresponsive episodes, gaps in time, olfactory/gustatory hallucinations, deja vu, rising epigastric sensation, focal numbness/tingling/weakness. She denies any headaches, dizziness, diplopia, dysarthria/dysphagia, neck/back pain, bowel/bladder dysfunction. Sleep is good until recently with her thyroid medications, she wakes up at 2am but goes back to sleep. She works at [employer]. Her brother had a single seizure. Otherwise she had a normal birth and early development.  There is no history of febrile convulsions, CNS infections such as meningitis/encephalitis, significant traumatic brain injury, neurosurgical procedures.  Diagnostic Data: MRI with and without contrast done 08/2021 did not show any acute changes, hippocampi symmetric with no abnormal signal or enhancement seen. There was note of very small foci of increased T2 FLAIR signal in the bilateral cerebral white  matter. There was prominence of palatine tonsils and lymph nodes in the upper neck.   1-hour EEG in 07/2021 was abnormal due to occasional generalized high voltage 4-5 Hz spike and polyspike and wave discharges lasting 1-3 seconds consistent with a primary  generalized epilepsy.    PAST MEDICAL HISTORY: Past Medical History:  Diagnosis Date   Epilepsia    Hyperthyroidism    Hyperthyroidism complicating pregnancy 04/03/2022    MEDICATIONS: Current Outpatient Medications on File Prior to Visit  Medication Sig Dispense Refill   levETIRAcetam (KEPPRA XR) 500 MG 24 hr tablet Take 3 tablets twice a day 180 tablet 0   XULANE 150-35 MCG/24HR transdermal patch APPLY 1 PATCH ONCE A WEEK 12 patch 3   No current facility-administered medications on file prior to visit.    ALLERGIES: No Known Allergies  FAMILY HISTORY: Family History  Problem Relation Age of Onset   Migraines Sister    Seizures Neg Hx    Autism Neg Hx    ADD / ADHD Neg Hx    Anxiety disorder Neg Hx    Depression Neg Hx    Bipolar disorder Neg Hx    Schizophrenia Neg Hx     SOCIAL HISTORY: Social History   Socioeconomic History   Marital status: Single    Spouse name: Not on file   Number of children: Not on file   Years of education: Not on file   Highest education level: Not on file  Occupational History   Not on file  Tobacco Use   Smoking status: Never    Passive exposure: Yes   Smokeless tobacco: Never  Vaping Use   Vaping status: Never Used  Substance and Sexual Activity   Alcohol use: No   Drug use: No   Sexual activity: Yes    Partners: Male    Birth control/protection: None  Other Topics Concern   Not on file  Social History Narrative   Patient lives with mom  She has graduated and is not attending college at the moment.    Right handed    Two story apartment       Social Determinants of Health   Financial Resource Strain: Not on file  Food Insecurity: Not on file  Transportation Needs: Not on file  Physical Activity: Not on file  Stress: Not on file  Social Connections: Not on file  Intimate Partner Violence: Not on file     PHYSICAL EXAM: Vitals:   05/03/23 1522  BP: 110/73  Pulse: 77  SpO2: 100%   General: No acute  distress Head:  Normocephalic/atraumatic Skin/Extremities: No rash, no edema Neurological Exam: alert and awake. No aphasia or dysarthria. Fund of knowledge is appropriate.  Attention and concentration are normal.   Cranial nerves: Pupils equal, round. Extraocular movements intact with no nystagmus. Visual fields full.  No facial asymmetry.  Motor: Bulk and tone normal, muscle strength 5/5 throughout with no pronator drift.   Finger to nose testing intact.  Gait narrow-based and steady, able to tandem walk adequately.  Romberg negative.   IMPRESSION: This is a pleasant 22 yo RH woman with a history of hyperthyroidism, with Juvenile Myoclonic Epilepsy. EEG showed generalized 4-5 Hz spike and polyspike and wave discharges. MRI brain unremarkable. She had a seizure in 07/2022 with undetectable Keppra level. She reports another seizure last month and states she has been taking Keppra XR 500mg  2 tabs BID (1000mg  BID) regularly since then. Check Keppra level. We discussed the importance of medication  compliance. She is aware of [place] driving laws to stop driving after a seizure until 6 months seizure-free. No pregnancy plans. Follow-up in 4 months, call for any changes.    Thank you for allowing me to participate in her care.  Please do not hesitate to call for any questions or concerns.   Patrcia Dolly, M.D.   CC: Dr. Sabino Dick

## 2023-05-03 NOTE — Patient Instructions (Signed)
Good to see you!  Have Keppra level done  2. Continue Keppra XR 500mg : take 2 tablets twice a day  3. Follow-up in 4 months, call for any changes   Seizure Precautions: 1. If medication has been prescribed for you to prevent seizures, take it exactly as directed.  Do not stop taking the medicine without talking to your doctor first, even if you have not had a seizure in a long time.   2. Avoid activities in which a seizure would cause danger to yourself or to others.  Don't operate dangerous machinery, swim alone, or climb in high or dangerous places, such as on ladders, roofs, or girders.  Do not drive unless your doctor says you may.  3. If you have any warning that you may have a seizure, lay down in a safe place where you can't hurt yourself.    4.  No driving for 6 months from last seizure, as per [facility].   Please refer to the following link on the Epilepsy Foundation of America's website for more information: http://www.epilepsyfoundation.org/answerplace/Social/driving/drivingu.cfm   5.  Maintain good sleep hygiene. Avoid alcohol.  6.  Notify your neurology if you are planning pregnancy or if you become pregnant.  7.  Contact your doctor if you have any problems that may be related to the medicine you are taking.  8.  Call 911 and bring the patient back to the ED if:        A.  The seizure lasts longer than 5 minutes.       B.  The patient doesn't awaken shortly after the seizure  C.  The patient has new problems such as difficulty seeing, speaking or moving  D.  The patient was injured during the seizure  E.  The patient has a temperature over 102 F (39C)  F.  The patient vomited and now is having trouble breathing

## 2023-05-05 ENCOUNTER — Other Ambulatory Visit: Payer: Medicaid Other

## 2023-05-05 DIAGNOSIS — G40B09 Juvenile myoclonic epilepsy, not intractable, without status epilepticus: Secondary | ICD-10-CM

## 2023-05-08 ENCOUNTER — Ambulatory Visit (INDEPENDENT_AMBULATORY_CARE_PROVIDER_SITE_OTHER): Payer: Medicaid Other | Admitting: Internal Medicine

## 2023-05-08 ENCOUNTER — Encounter: Payer: Self-pay | Admitting: Internal Medicine

## 2023-05-08 VITALS — BP 110/72 | HR 65 | Ht 60.0 in | Wt 151.0 lb

## 2023-05-08 DIAGNOSIS — E0590 Thyrotoxicosis, unspecified without thyrotoxic crisis or storm: Secondary | ICD-10-CM

## 2023-05-08 DIAGNOSIS — E0500 Thyrotoxicosis with diffuse goiter without thyrotoxic crisis or storm: Secondary | ICD-10-CM

## 2023-05-08 LAB — T3, FREE: T3, Free: 4 pg/mL (ref 2.3–4.2)

## 2023-05-08 LAB — TSH: TSH: 0.07 u[IU]/mL — ABNORMAL LOW (ref 0.35–5.50)

## 2023-05-08 LAB — T4, FREE: Free T4: 1.19 ng/dL (ref 0.60–1.60)

## 2023-05-08 NOTE — Progress Notes (Unsigned)
Name: Megan Cruz  MRN/ DOB: [id number], [birth year]    Age/ Sex: 22 y.o., female    PCP: Christel Mormon, MD   Reason for Endocrinology Evaluation: Hyperthyroidism     Date of Initial Endocrinology Evaluation: 07/27/2021    HPI: Megan Cruz is a 22 y.o. female with a past medical history of hyperthyroidism and seizure disorder.  The patient presented for initial endocrinology clinic visit on 07/27/2021  for consultative assistance with her Hyperthyroidism.   She was diagnosed with hyperthyroidism during evaluation of tachycardia on 07/21/2021 with a suppressed TSH <0.01 uIU/mL and elevated FT4 4.02 ng/dL and T3 at 409 ng/dL . She was started on Methimazole at the time.    Prior to starting methimazole she  did not notice any weight loss, local neck symptoms but noted hand tremors . No palpitations except the day she presented to the ED   On her initial visit to  our clinic she was on Methimazole but we switched to PTU shortly after pregnancy diagnosis in 07/2021  TRAB elevated at 6.37 IU/L Sister with hypothyroidism   Patient was lost to follow-up after March 2023  She is S/P delivery 03/2022- baby girl   Upon her return to our clinic 10/2022 she has been off methimazole since 8/ 2023, with normal TFTs   SUBJECTIVE:    Today (05/08/23):  [name] is here for a follow up for hyperthyroidism .    She continues to follow-up with neurology for history of seizure disorder, she is on Keppra  Weight stable  Denies local neck swelling  Denies palpitations  Denies  tremors  Denies  diarrhea or loose stools  Denies burning or itching     HISTORY:  Past Medical History:  Past Medical History:  Diagnosis Date   Epilepsia    Hyperthyroidism    Hyperthyroidism complicating pregnancy 04/03/2022   Past Surgical History:  Past Surgical History:  Procedure Laterality Date   APPENDECTOMY     CYST REMOVAL PEDIATRIC Right 07/22/2013   Procedure: CYST REMOVAL  PEDIATRIC;  Surgeon: Judie Petit. Leonia Corona, MD;  Location[facility];  Service: Pediatrics;  Laterality: Right;   LAPAROSCOPIC APPENDECTOMY N/A 07/22/2013   Procedure: APPENDECTOMY LAPAROSCOPIC;  Surgeon: Judie Petit. Leonia Corona, MD;  Location: [facility];  Service: Pediatrics;  Laterality: N/A;    Social History:  reports that she has never smoked. She has been exposed to tobacco smoke. She has never used smokeless tobacco. She reports that she does not drink alcohol and does not use drugs. Family History: family history includes Migraines in her sister.   HOME MEDICATIONS: Allergies as of 05/08/2023   No Known Allergies      Medication List        Accurate as of May 08, 2023  1:53 PM. If you have any questions, ask your nurse or doctor.          levETIRAcetam 500 MG 24 hr tablet Commonly known as: KEPPRA XR Take 2 tablets twice a day   Xulane 150-35 MCG/24HR transdermal patch Generic drug: norelgestromin-ethinyl estradiol APPLY 1 PATCH ONCE A WEEK          REVIEW OF SYSTEMS: A comprehensive ROS was conducted with the patient and is negative except as per HPI     OBJECTIVE:  VS: BP 110/72 (BP Location: Left Arm, Patient Position: Sitting, Cuff Size: Small)   Pulse 65   Ht 5' (1.524 m)   Wt 151 lb (68.5 kg)   SpO2  99%   BMI 29.49 kg/m    Wt Readings from Last 3 Encounters:  05/08/23 151 lb (68.5 kg)  05/03/23 151 lb 3.2 oz (68.6 kg)  10/24/22 148 lb (67.1 kg)     EXAM: General: Pt appears well and is in NAD  Eyes: External eye exam normal without stare, lid lag or exophthalmos.  EOM intact.   Neck: General: Supple without adenopathy. Thyroid: Thyroid size normal.  No goiter or nodules appreciated  Lungs: Clear with good BS bilat   Heart: Auscultation: RRR  Abdomen: soft, non tender  Extremities:  BL LE: No pretibial edema normal  Mental Status: Judgment, insight: Intact Orientation: Oriented to time, place, and person Mood and affect: No depression, anxiety,  or agitation     DATA REVIEWED:   Latest Reference Range & Units 05/08/23 13:58  TSH 0.35 - 5.50 uIU/mL 0.07 (L)  Triiodothyronine,Free,Serum 2.3 - 4.2 pg/mL 4.0  T4,Free(Direct) 0.60 - 1.60 ng/dL 1.61      Latest Reference Range & Units Most Recent  TRAB <=2.00 IU/L 6.37 (H) 07/27/21 10:44    ASSESSMENT/PLAN/RECOMMENDATIONS:   Hyperthyroidism:  - Pt is clinically euthyroid - NO local neck symptoms  -She has not been on methimazole for 2 years -TFTs today shows low TSH with normal free T4 and T3, I would recommend restarting methimazole as below  Medication Start methimazole 5 mg daily   2. Graves' Disease:    - No extra thyroidal manifestations of Graves' disease   F/U in 6 months  Repeat labs in 2 months  Signed electronically by: Lyndle Herrlich, MD  [facility] Endocrinology  [facility] [address] [place] [place], [place] [address] Phone: [phone] FAX: [phone]   CC: Christel Mormon, MD [address] [place] [address] Phone: [phone] Fax: [phone]   Return to Endocrinology clinic as below: Future Appointments  Date Time Provider Department Center  09/01/2023  3:30 PM Van Clines, MD [facility] None

## 2023-05-08 NOTE — Patient Instructions (Signed)
You have not needed any thyroid medication in 2 years.   Please have your thyroid checked once a year through your primary care doctor, we will be happy to see you if any thyroid problem might occur

## 2023-05-09 ENCOUNTER — Telehealth: Payer: Self-pay | Admitting: Internal Medicine

## 2023-05-09 LAB — LEVETIRACETAM LEVEL: Keppra (Levetiracetam): 19.8 ug/mL

## 2023-05-09 MED ORDER — METHIMAZOLE 5 MG PO TABS: 5.0000 mg | ORAL_TABLET | Freq: Every day | ORAL | 3 refills | Status: DC

## 2023-05-09 NOTE — Telephone Encounter (Signed)
Please contact the patient and schedule for follow-up visit with me in 6 months  Please schedule the patient for a lab appointment only in 2 months   Thanks

## 2023-05-10 ENCOUNTER — Telehealth: Payer: Self-pay

## 2023-05-10 NOTE — Telephone Encounter (Signed)
LMx1 to schedule 2 month lab appoitment and 6 month follow up appointment with Dr. Lonzo Cloud.

## 2023-05-10 NOTE — Telephone Encounter (Signed)
Pt called no answer per DPR left a voice mail Keppra level looks good, she needs to continue taking medication regularly to help prevent another seizure.

## 2023-05-10 NOTE — Telephone Encounter (Signed)
-----   Message from Van Clines sent at 05/09/2023  3:44 PM EDT ----- Pls let her know Keppra level looks good, she needs to continue taking medication regularly to help prevent another seizure. Thanks

## 2023-05-12 NOTE — Telephone Encounter (Signed)
LMx1 to schedule lab appointment and follow up appointment with Dr. Lonzo Cloud.

## 2023-06-29 IMAGING — MR MR HEAD WO/W CM
11 of 13 series · 34 of 48 positions shown · IV contrast (multihance)
Comparison: None.

CLINICAL DATA: Provided history: New onset seizure. Additional
history provided by scanning technologist: Patient reports seizure
07/20/2021.

EXAM:
MRI HEAD WITHOUT AND WITH CONTRAST
TECHNIQUE: Multiplanar, multiecho pulse sequences of the brain and surrounding
structures were obtained without and with intravenous contrast.
CONTRAST:  14mL MULTIHANCE GADOBENATE DIMEGLUMINE 529 MG/ML IV SOLN

[Series 2: T1 · sagittal · 5.0mm · 0.45mm/px · 3 of 25 slices shown]
[im 1/25]
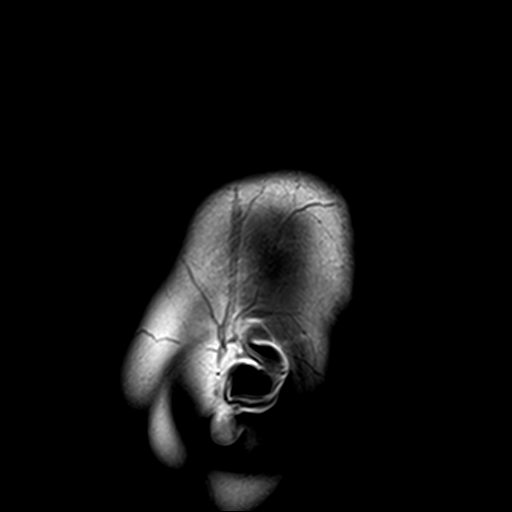
[im 13/25]
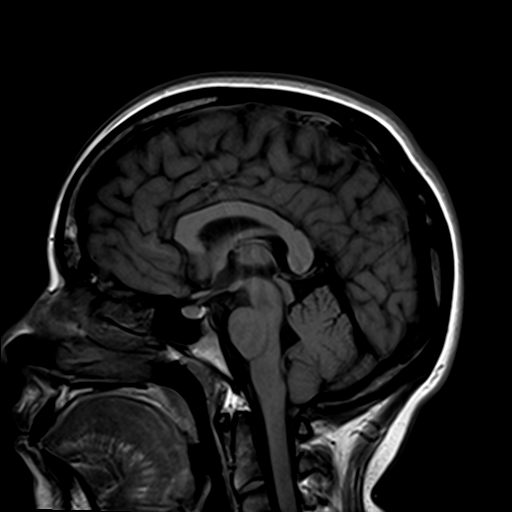
[im 25/25]
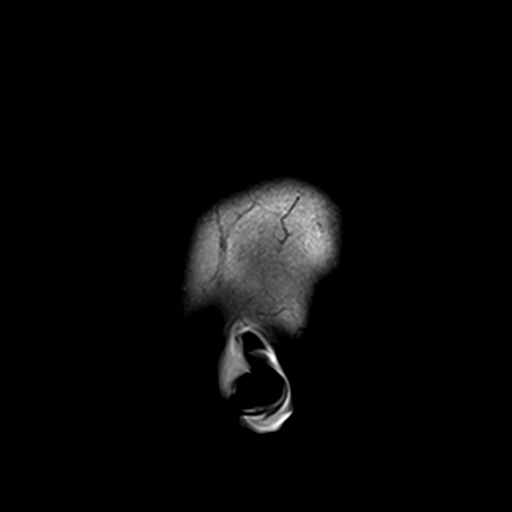

[Series 3: DWI · axial · 3.0mm · 1.80mm/px · z∈[-55,+88]mm · 8 of 100 slices shown (1 of 2)]
[im 1/100]
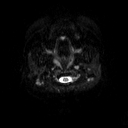
[im 15/100]
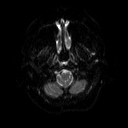
[im 29/100]
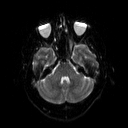
[im 43/100]
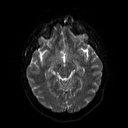
[im 57/100]
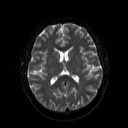
[im 71/100]
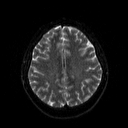
[im 85/100]
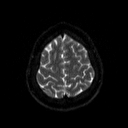
[im 100/100]
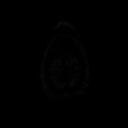

[Series 4: DWI · axial · 3.0mm · 1.80mm/px · z∈[-55,+88]mm · 4 of 50 slices shown (2 of 2)]
[im 1/50]
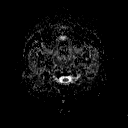
[im 17/50]
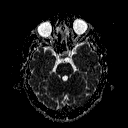
[im 33/50]
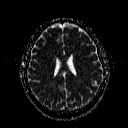
[im 50/50]
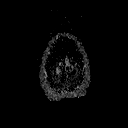

[Series 5: T2 · axial · 5.0mm · 0.51mm/px · z∈[-59,+92]mm · 2 of 24 slices shown (1 of 3)]
[im 1/24]
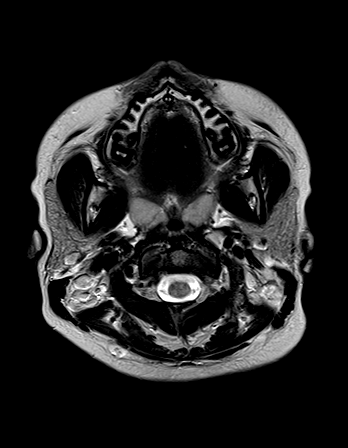
[im 24/24]
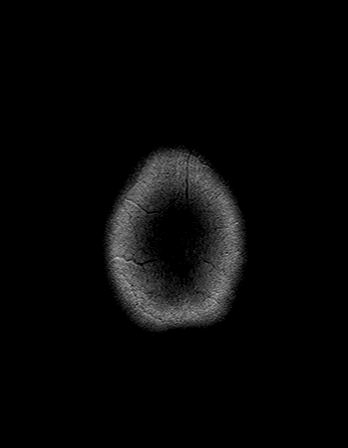

[Series 6: FLAIR · axial · 3.0mm · 0.45mm/px · z∈[-56,+89]mm · 2 of 33 slices shown (1 of 2)]
[im 1/33]
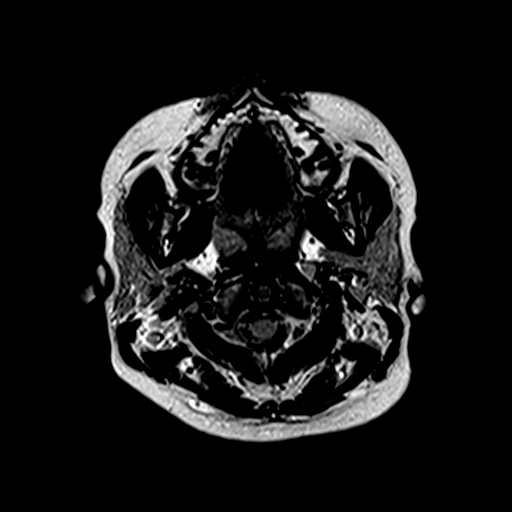
[im 33/33]
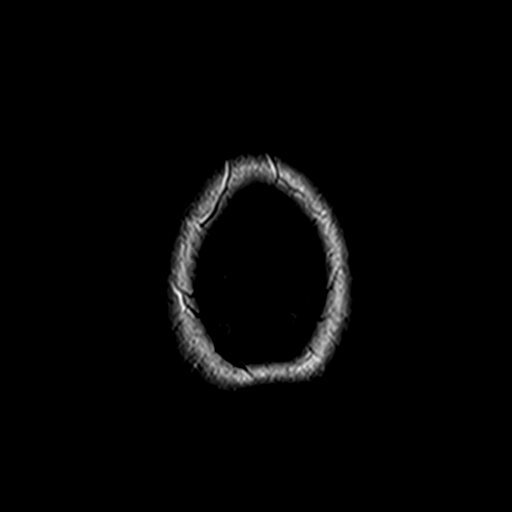

[Series 8: swi_images · axial · 4.0mm · 0.90mm/px · z∈[-60,+93]mm · 3 of 40 slices shown]
[im 1/40]
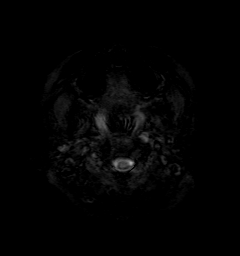
[im 20/40]
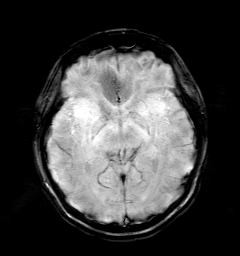
[im 40/40]
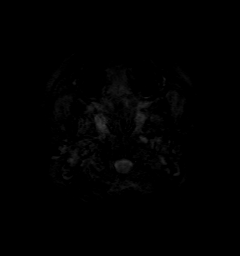

[Series 9: t1_mpr_tra · axial · 2.0mm · 0.45mm/px · z∈[-53,+86]mm · 5 of 72 slices shown (1 of 2)]
[im 1/72]
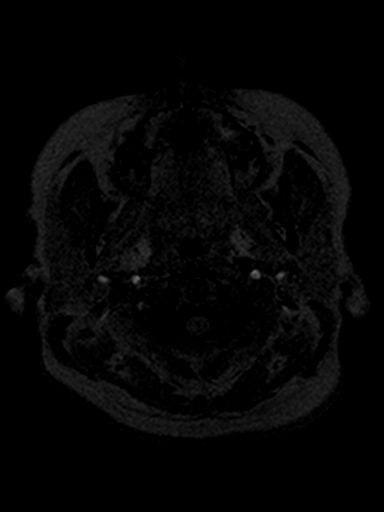
[im 18/72]
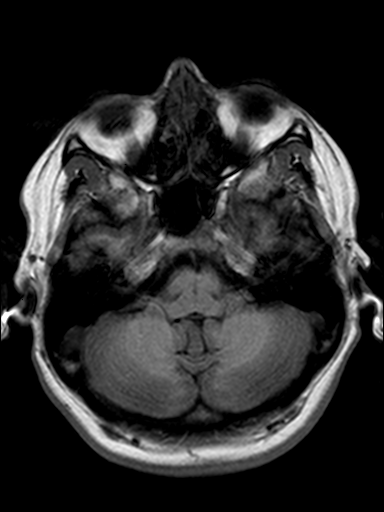
[im 36/72]
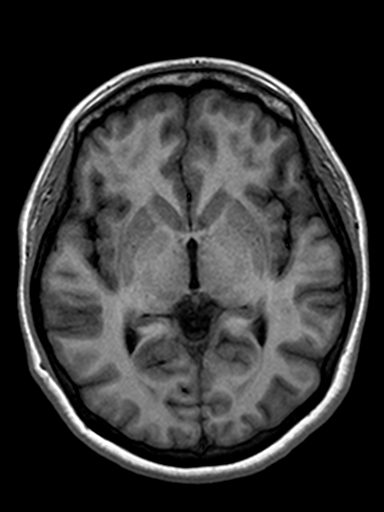
[im 54/72]
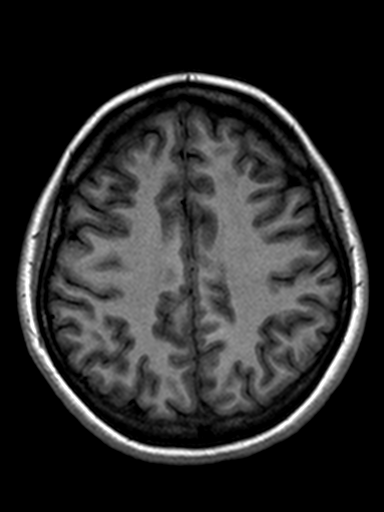
[im 72/72]
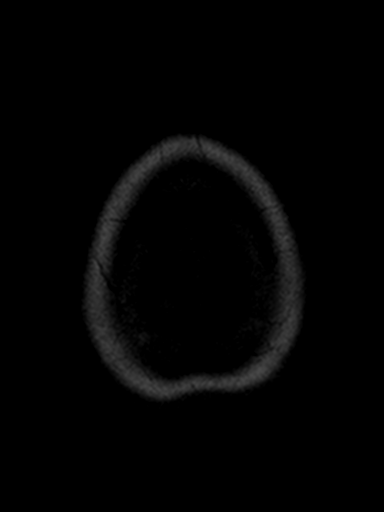

[Series 10: T2 · coronal · 3.0mm · 0.23mm/px · 2 of 28 slices shown (2 of 3)]
[im 1/28]
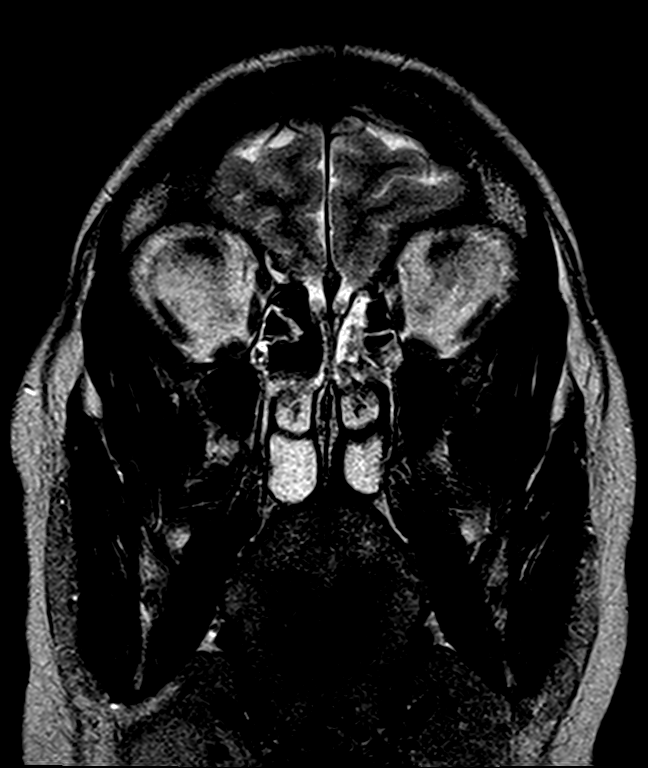
[im 28/28]
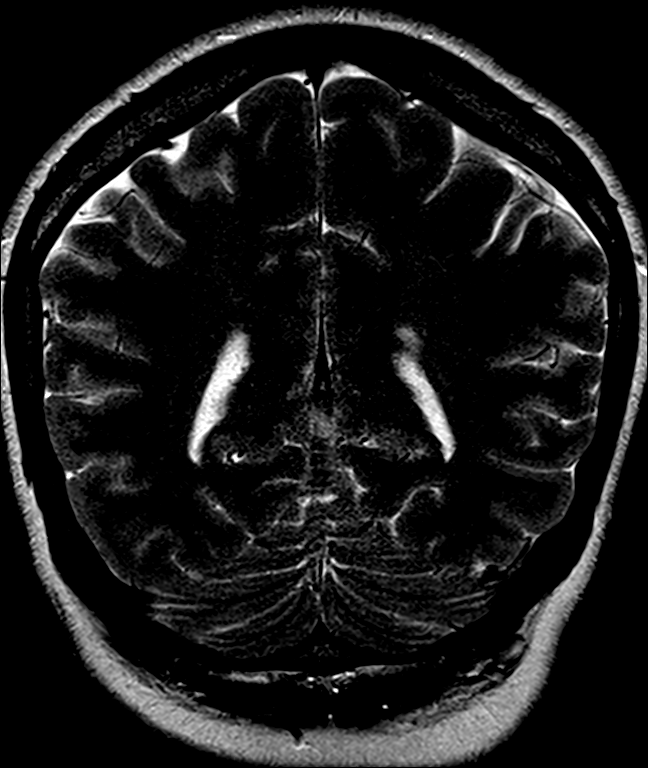

[Series 11: FLAIR · coronal · 3.0mm · 0.70mm/px · 2 of 28 slices shown (2 of 2)]
[im 1/28]
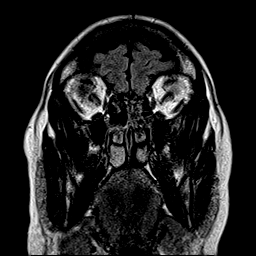
[im 28/28]
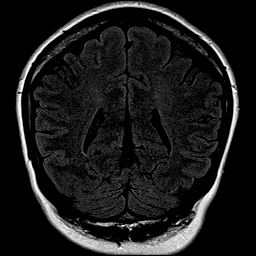

[Series 12: T2 · coronal · 5.0mm · 0.45mm/px · 2 of 27 slices shown (3 of 3)]
[im 1/27]
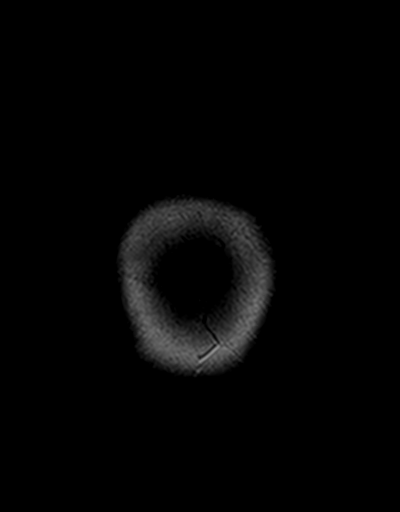
[im 27/27]
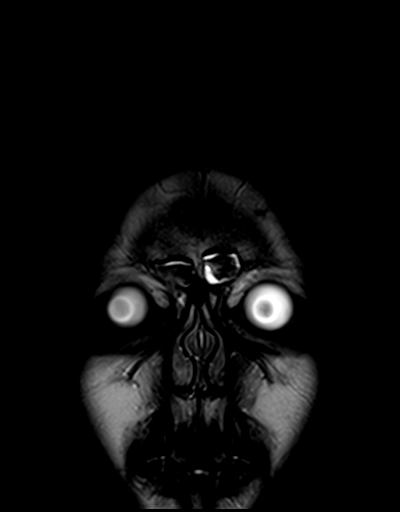

[Series 13: t1_mpr_tra · axial · 2.0mm · 0.45mm/px · 1 of 72 slices shown (2 of 2)]
[im 1/72]
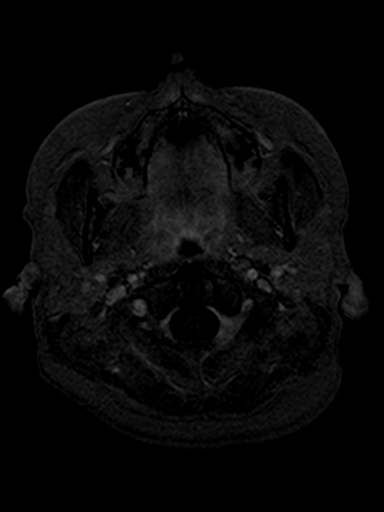

[34 of 48 positions shown; findings below may reference images not displayed]

FINDINGS: Brain:

Mild intermittent motion degradation.

Cerebral volume is normal.

There are several small foci of T2 FLAIR hyperintense signal
abnormality within the bilateral cerebral white matter (measuring up
to 3 mm), nonspecific.

No cortical encephalomalacia is identified.

The hippocampi appear symmetric in size and signal.

There is no acute infarct.

No evidence of an intracranial mass.

No chronic intracranial blood products.

No extra-axial fluid collection.

No midline shift.

No pathologic intracranial enhancement identified.

Vascular: Maintained flow voids within the proximal large arterial
vessels. Dominant right vertebral artery.

Skull and upper cervical spine: No focal suspicious marrow lesion.

Sinuses/Orbits: Visualized orbits show no acute finding. Mild
mucosal thickening within the bilateral frontal, ethmoid, sphenoid
and maxillary sinuses. Superimposed small mucous retention cyst
within the right sphenoid sinus. Superimposed 16 mm mucous retention
cyst within the left maxillary sinus.

Other: Nonspecific symmetric prominence of the palatine tonsils.
Additionally, lymph nodes are prominent in number in the visualized
upper neck.
IMPRESSION: No evidence of acute intracranial abnormality.

Several small nonspecific T2 FLAIR hyperintense remote insults
within the bilateral cerebral white matter (measuring up to 3 mm).

Otherwise unremarkable MRI appearance of the brain.

Nonspecific symmetric prominence of the palatine tonsils.
Additionally, lymph nodes are prominent in number within the
visualized upper neck. Clinical correlation recommended.

Paranasal sinus disease, as described.

## 2023-07-04 ENCOUNTER — Other Ambulatory Visit (INDEPENDENT_AMBULATORY_CARE_PROVIDER_SITE_OTHER): Payer: Medicaid Other

## 2023-07-04 DIAGNOSIS — E0590 Thyrotoxicosis, unspecified without thyrotoxic crisis or storm: Secondary | ICD-10-CM | POA: Diagnosis not present

## 2023-07-05 LAB — T4, FREE: Free T4: 1.02 ng/dL (ref 0.60–1.60)

## 2023-07-05 LAB — TSH: TSH: 0.44 u[IU]/mL (ref 0.35–5.50)

## 2023-09-01 ENCOUNTER — Encounter: Payer: Self-pay | Admitting: Neurology

## 2023-09-01 ENCOUNTER — Telehealth (INDEPENDENT_AMBULATORY_CARE_PROVIDER_SITE_OTHER): Payer: Medicaid Other | Admitting: Neurology

## 2023-09-01 VITALS — Ht 60.0 in | Wt 155.0 lb

## 2023-09-01 DIAGNOSIS — G40B09 Juvenile myoclonic epilepsy, not intractable, without status epilepticus: Secondary | ICD-10-CM | POA: Diagnosis not present

## 2023-09-01 MED ORDER — LEVETIRACETAM ER 500 MG PO TB24: ORAL_TABLET | ORAL | 3 refills | Status: DC

## 2023-09-01 NOTE — Progress Notes (Signed)
 Virtual Visit via Video Note The purpose of this virtual visit is to provide medical care while limiting exposure to the novel coronavirus.    Consent was obtained for video visit:  Yes.   Answered questions that patient had about telehealth interaction:  Yes.     Pt location: Home Physician Location: office Name of referring provider:  Coccaro, Peter J, MD I connected with Lynell KATHEE Bastos at patients initiation/request on 09/01/2023 at  3:30 PM EST by video enabled telemedicine application and verified that I am speaking with the correct person using two identifiers. Pt MRN:  [id number] Pt DOB:  12/04/2000 Video Participants:  Lynell KATHEE Bastos   History of Present Illness:  The patient had a virtual video visit on 09/01/2023. 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 Since her last visit, she reports doing well seizure-free since 03/2023 (due to missed medication, Keppra  level in 07/2022 was <2.0). Discussed importance of compliance, repeat Keppra  level 04/2023 was 19.8. She denies any staring/unresponsive episodes, gaps in time, olfactory/gustatory hallucinations, focal numbness/tingling/weakness, myoclonic jerks. No headaches, dizziness, vision changes, no falls. No side effects on Levetiracetam  ER 500mg : 2 tabs BID (1000mg  BID). She gets 5 hours of sleep. Mood is good.    History on Initial Assessment 08/02/2021: This is a 23 year old right-handed woman with a history of newly diagnosed hyperthyroidism presenting for evaluation of seizures. She was evaluated by pediatric neurologist Dr. Corinthia in 01/2018 after an episode of eye rolling, confusion, and brief jerking when her mother woke her up in the middle of the night to take care of a baby. She remembered jerking and was able to go back to sleep. She noted occasional brief jerking in her arms when she wakes up in the morning. EEG at that time was normal. She did well for several years until  07/20/21 when she was brought to the ER for seizure-like activity. There was a family argument and she recalled going to her bedroom where her mother found her having generalized shaking for 1-2 minutes. She denies any prior warning symptoms. She woke up in her bed and found she bit her tongue and both legs were weak. She admitted to taking a THC gummy prior to the event, which she has had in the past with no issues. She was tachycardic in the 160s-170s, given IV fluids.  Bloodwork showed normal CBC, BMP. Her TSH was very low at <0.010, free T4 was 4.02, T3 425, she was started on methimazole . She was seen by Endocrinology and started on atenolol . She lives with her mother. She denies any staring/unresponsive episodes, gaps in time, olfactory/gustatory hallucinations, deja vu, rising epigastric sensation, focal numbness/tingling/weakness. She denies any headaches, dizziness, diplopia, dysarthria/dysphagia, neck/back pain, bowel/bladder dysfunction. Sleep is good until recently with her thyroid  medications, she wakes up at 2am but goes back to sleep. She works at [employer]. Her brother had a single seizure. Otherwise she had a normal birth and early development.  There is no history of febrile convulsions, CNS infections such as meningitis/encephalitis, significant traumatic brain injury, neurosurgical procedures.  Diagnostic Data: MRI with and without contrast done 08/2021 did not show any acute changes, hippocampi symmetric with no abnormal signal or enhancement seen. There was note of very small foci of increased T2 FLAIR signal in the bilateral cerebral white matter. There was prominence of palatine tonsils and lymph nodes in the upper neck.   1-hour EEG in 07/2021 was abnormal due to  occasional generalized high voltage 4-5 Hz spike and polyspike and wave discharges lasting 1-3 seconds consistent with a primary generalized epilepsy.     Current Outpatient Medications on File Prior to Visit  Medication Sig  Dispense Refill   levETIRAcetam  (KEPPRA  XR) 500 MG 24 hr tablet Take 2 tablets twice a day 360 tablet 3   methimazole  (TAPAZOLE ) 5 MG tablet Take 1 tablet (5 mg total) by mouth daily. 90 tablet 3   XULANE  150-35 MCG/24HR transdermal patch APPLY 1 PATCH ONCE A WEEK 12 patch 3   No current facility-administered medications on file prior to visit.     Observations/Objective:   Vitals:   09/01/23 0951  Weight: 155 lb (70.3 kg)  Height: 5' (1.524 m)   GEN:  The patient appears stated age and is in NAD.  Neurological examination: Patient is awake, alert. No aphasia or dysarthria. Intact fluency and comprehension. Cranial nerves: Extraocular movements intact with no nystagmus. No facial asymmetry. Motor: moves all extremities symmetrically, at least anti-gravity x 4. No incoordination on finger to nose testing. Gait: narrow-based and steady, able to tandem walk adequately.    Assessment and Plan:   This is a pleasant 23 yo RH woman with a history of hyperthyroidism, with Juvenile Myoclonic Epilepsy. EEG showed generalized 4-5 Hz spike and polyspike and wave discharges. MRI brain unremarkable. She denies any seizures since 03/2023 on Levetiracetam  ER 500mg  2 tabs BID (1000mg  BID). We again discussed avoidance of seizure triggers, including missing medication, sleep deprivation, alcohol. She is aware of [place] driving laws to stop driving after a seizure until 6 months seizure-free. Follow-up in 6 months, call for any changes.    Follow Up Instructions:   -I discussed the assessment and treatment plan with the patient. The patient was provided an opportunity to ask questions and all were answered. The patient agreed with the plan and demonstrated an understanding of the instructions.   The patient was advised to call back or seek an in-person evaluation if the symptoms worsen or if the condition fails to improve as anticipated.    Darice CHRISTELLA Shivers, MD

## 2023-09-01 NOTE — Patient Instructions (Signed)
 Good to see you doing well. Continue Keppra  XR 500mg : Take 2 tablets twice a day. Follow-up in 6 months, call for any changes.    Seizure Precautions: 1. If medication has been prescribed for you to prevent seizures, take it exactly as directed.  Do not stop taking the medicine without talking to your doctor first, even if you have not had a seizure in a long time.   2. Avoid activities in which a seizure would cause danger to yourself or to others.  Don't operate dangerous machinery, swim alone, or climb in high or dangerous places, such as on ladders, roofs, or girders.  Do not drive unless your doctor says you may.  3. If you have any warning that you may have a seizure, lay down in a safe place where you can't hurt yourself.    4.  No driving for 6 months from last seizure, as per [place]  state law.   Please refer to the following link on the Epilepsy Foundation of America's website for more information: http://www.epilepsyfoundation.org/answerplace/Social/driving/drivingu.cfm   5.  Maintain good sleep hygiene. Avoid alcohol  6.  Notify your neurology if you are planning pregnancy or if you become pregnant.  7.  Contact your doctor if you have any problems that may be related to the medicine you are taking.  8.  Call 911 and bring the patient back to the ED if:        A.  The seizure lasts longer than 5 minutes.       B.  The patient doesn't awaken shortly after the seizure  C.  The patient has new problems such as difficulty seeing, speaking or moving  D.  The patient was injured during the seizure  E.  The patient has a temperature over 102 F (39C)  F.  The patient vomited and now is having trouble breathing

## 2023-09-07 ENCOUNTER — Encounter: Payer: Self-pay | Admitting: Neurology

## 2023-09-15 ENCOUNTER — Ambulatory Visit: Payer: Medicaid Other | Admitting: Internal Medicine

## 2023-11-13 ENCOUNTER — Ambulatory Visit: Payer: Medicaid Other | Admitting: Internal Medicine

## 2023-11-13 NOTE — Progress Notes (Deleted)
 Name: Megan Cruz  MRN/ DOB: [id number], 11-09-2000    Age/ Sex: 23 y.o., female    PCP: Christel Mormon, MD   Reason for Endocrinology Evaluation: Hyperthyroidism     Date of Initial Endocrinology Evaluation: 07/27/2021    HPI: [name] is a 23 y.o. female with a past medical history of hyperthyroidism and seizure disorder.  The patient presented for initial endocrinology clinic visit on 07/27/2021  for consultative assistance with her Hyperthyroidism.   She was diagnosed with hyperthyroidism during evaluation of tachycardia on 07/21/2021 with a suppressed TSH <0.01 uIU/mL and elevated FT4 4.02 ng/dL and T3 at 829 ng/dL . She was started on Methimazole at the time.    Prior to starting methimazole she  did not notice any weight loss, local neck symptoms but noted hand tremors . No palpitations except the day she presented to the ED   On her initial visit to  our clinic she was on Methimazole but we switched to PTU shortly after pregnancy diagnosis in 07/2021  TRAB elevated at 6.37 IU/L Sister with hypothyroidism   Patient was lost to follow-up after March 2023  She is S/P delivery 03/2022- baby girl   Upon her return to our clinic 10/2022 she has been off methimazole since 8/ 2023, with normal TFTs   SUBJECTIVE:    Today (11/13/23):  [name] is here for a follow up for hyperthyroidism .    She continues to follow-up with neurology for history of seizure disorder, she is on Keppra  Weight stable  Denies local neck swelling  Denies palpitations  Denies  tremors  Denies  diarrhea or loose stools  Denies burning or itching   Methimazole 5 mg daily   HISTORY:  Past Medical History:  Past Medical History:  Diagnosis Date   Epilepsia    Hyperthyroidism    Hyperthyroidism complicating pregnancy 04/03/2022   Past Surgical History:  Past Surgical History:  Procedure Laterality Date   APPENDECTOMY     CYST REMOVAL PEDIATRIC Right 07/22/2013    Procedure: CYST REMOVAL PEDIATRIC;  Surgeon: Judie Petit. Leonia Corona, MD;  Location: [facility] OR;  Service: Pediatrics;  Laterality: Right;   LAPAROSCOPIC APPENDECTOMY N/A 07/22/2013   Procedure: APPENDECTOMY LAPAROSCOPIC;  Surgeon: Judie Petit. Leonia Corona, MD;  Location: [facility] OR;  Service: Pediatrics;  Laterality: N/A;    Social History:  reports that she has never smoked. She has been exposed to tobacco smoke. She has never used smokeless tobacco. She reports that she does not drink alcohol and does not use drugs. Family History: family history includes Migraines in her sister.   HOME MEDICATIONS: Allergies as of 11/13/2023   No Known Allergies      Medication List        Accurate as of November 13, 2023 11:32 AM. If you have any questions, ask your nurse or doctor.          levETIRAcetam 500 MG 24 hr tablet Commonly known as: KEPPRA XR Take 2 tablets twice a day   methimazole 5 MG tablet Commonly known as: TAPAZOLE Take 1 tablet (5 mg total) by mouth daily.   Xulane 150-35 MCG/24HR transdermal patch Generic drug: norelgestromin-ethinyl estradiol APPLY 1 PATCH ONCE A WEEK          REVIEW OF SYSTEMS: A comprehensive ROS was conducted with the patient and is negative except as per HPI     OBJECTIVE:  VS: There were no vitals taken for this visit.  Wt Readings from Last 3 Encounters:  09/01/23 155 lb (70.3 kg)  05/08/23 151 lb (68.5 kg)  05/03/23 151 lb 3.2 oz (68.6 kg)     EXAM: General: Pt appears well and is in NAD  Eyes: External eye exam normal without stare, lid lag or exophthalmos.  EOM intact.   Neck: General: Supple without adenopathy. Thyroid: Thyroid size normal.  No goiter or nodules appreciated  Lungs: Clear with good BS bilat   Heart: Auscultation: RRR  Abdomen: soft, non tender  Extremities:  BL LE: No pretibial edema normal  Mental Status: Judgment, insight: Intact Orientation: Oriented to time, place, and person Mood and affect: No depression, anxiety, or  agitation     DATA REVIEWED:   Latest Reference Range & Units 05/08/23 13:58  TSH 0.35 - 5.50 uIU/mL 0.07 (L)  Triiodothyronine,Free,Serum 2.3 - 4.2 pg/mL 4.0  T4,Free(Direct) 0.60 - 1.60 ng/dL 8.41      Latest Reference Range & Units Most Recent  TRAB <=2.00 IU/L 6.37 (H) 07/27/21 10:44    ASSESSMENT/PLAN/RECOMMENDATIONS:   Hyperthyroidism:  - Pt is clinically euthyroid - NO local neck symptoms  -She has not been on methimazole for 2 years -TFTs today shows low TSH with normal free T4 and T3, I would recommend restarting methimazole as below  Medication Start methimazole 5 mg daily   2. Graves' Disease:    - No extra thyroidal manifestations of Graves' disease   F/U in 6 months  Repeat labs in 2 months  Signed electronically by: Lyndle Herrlich, MD  [facility] Endocrinology  [facility] [address] [place] [place], [place] [address] Phone: [phone] FAX: [phone]   CC: Christel Mormon, MD [address] [place] [address] Phone: [phone] Fax: [phone]   Return to Endocrinology clinic as below: Future Appointments  Date Time Provider Department Center  11/13/2023  3:00 PM Shelisa Fern, Konrad Dolores, MD [facility] None  04/05/2024  2:00 PM Van Clines, MD [facility] None

## 2023-12-01 ENCOUNTER — Encounter: Payer: Self-pay | Admitting: Internal Medicine

## 2023-12-01 ENCOUNTER — Ambulatory Visit (INDEPENDENT_AMBULATORY_CARE_PROVIDER_SITE_OTHER): Admitting: Internal Medicine

## 2023-12-01 VITALS — BP 104/66 | HR 66 | Ht 60.0 in | Wt 152.0 lb

## 2023-12-01 DIAGNOSIS — E0500 Thyrotoxicosis with diffuse goiter without thyrotoxic crisis or storm: Secondary | ICD-10-CM

## 2023-12-01 DIAGNOSIS — E0590 Thyrotoxicosis, unspecified without thyrotoxic crisis or storm: Secondary | ICD-10-CM | POA: Diagnosis not present

## 2023-12-01 NOTE — Progress Notes (Signed)
 Name: Megan Cruz  MRN/ DOB: [id number], Nov 02, 2000    Age/ Sex: 23 y.o., female    PCP: Brendan Call, MD   Reason for Endocrinology Evaluation: Hyperthyroidism     Date of Initial Endocrinology Evaluation: 07/27/2021    HPI: Megan Cruz is a 23 y.o. female with a past medical history of hyperthyroidism and seizure disorder.  The patient presented for initial endocrinology clinic visit on 07/27/2021  for consultative assistance with her Hyperthyroidism.   She was diagnosed with hyperthyroidism during evaluation of tachycardia on 07/21/2021 with a suppressed TSH <0.01 uIU/mL and elevated FT4 4.02 ng/dL and T3 at 474 ng/dL . She was started on Methimazole at the time.    Prior to starting methimazole she  did not notice any weight loss, local neck symptoms but noted hand tremors . No palpitations except the day she presented to the ED   On her initial visit to  our clinic she was on Methimazole but we switched to PTU shortly after pregnancy diagnosis in 07/2021  TRAB elevated at 6.37 IU/L Sister with hypothyroidism   Patient was lost to follow-up after March 2023  She is S/P delivery 03/2022- baby girl   Upon her return to our clinic 10/2022 she has been off methimazole since 8/ 2023, with normal TFTs   SUBJECTIVE:    Today (12/04/23):  [name] is here for a follow up for hyperthyroidism .    She continues to follow-up with neurology for history of seizure disorder, she is on Keppra  Weight stable but she is c/o difficulty losing weight Denies local neck swelling  Denies palpitations  Denies  tremors  Denies  diarrhea or loose stools  Denies eye burning or itching  LMP 2 weeks ago    Methimazole 5 mg daily   HISTORY:  Past Medical History:  Past Medical History:  Diagnosis Date   Epilepsia    Hyperthyroidism    Hyperthyroidism complicating pregnancy 04/03/2022   Past Surgical History:  Past Surgical History:  Procedure Laterality Date    APPENDECTOMY     CYST REMOVAL PEDIATRIC Right 07/22/2013   Procedure: CYST REMOVAL PEDIATRIC;  Surgeon: Melven Stable. Alanda Allegra, MD;  Location: [facility] OR;  Service: Pediatrics;  Laterality: Right;   LAPAROSCOPIC APPENDECTOMY N/A 07/22/2013   Procedure: APPENDECTOMY LAPAROSCOPIC;  Surgeon: Melven Stable. Alanda Allegra, MD;  Location: [facility] OR;  Service: Pediatrics;  Laterality: N/A;    Social History:  reports that she has never smoked. She has been exposed to tobacco smoke. She has never used smokeless tobacco. She reports that she does not drink alcohol and does not use drugs. Family History: family history includes Migraines in her sister.   HOME MEDICATIONS: Allergies as of 12/01/2023   No Known Allergies      Medication List        Accurate as of December 01, 2023 11:59 PM. If you have any questions, ask your nurse or doctor.          levETIRAcetam 500 MG 24 hr tablet Commonly known as: KEPPRA XR Take 2 tablets twice a day   methimazole 5 MG tablet Commonly known as: TAPAZOLE Take 1 tablet (5 mg total) by mouth daily.   Xulane 150-35 MCG/24HR transdermal patch Generic drug: norelgestromin-ethinyl estradiol APPLY 1 PATCH ONCE A WEEK          REVIEW OF SYSTEMS: A comprehensive ROS was conducted with the patient and is negative except as per HPI  OBJECTIVE:  VS: BP 104/66 (BP Location: Left Arm, Patient Position: Sitting, Cuff Size: Normal)   Pulse 66   Ht 5' (1.524 m)   Wt 152 lb (68.9 kg)   SpO2 99%   BMI 29.69 kg/m    Wt Readings from Last 3 Encounters:  12/01/23 152 lb (68.9 kg)  09/01/23 155 lb (70.3 kg)  05/08/23 151 lb (68.5 kg)     EXAM: General: Pt appears well and is in NAD  Eyes: External eye exam normal without stare, lid lag or exophthalmos.  EOM intact.   Neck: General: Supple without adenopathy. Thyroid: Thyroid size normal.  No goiter or nodules appreciated  Lungs: Clear with good BS bilat   Heart: Auscultation: RRR  Abdomen: soft, non tender   Extremities:  BL LE: No pretibial edema normal  Mental Status: Judgment, insight: Intact Orientation: Oriented to time, place, and person Mood and affect: No depression, anxiety, or agitation     DATA REVIEWED:   Latest Reference Range & Units 12/01/23 12:34  TSH mIU/L 0.47  T4,Free(Direct) 0.8 - 1.8 ng/dL 1.2        Latest Reference Range & Units Most Recent  TRAB <=2.00 IU/L 6.37 (H) 07/27/21 10:44    ASSESSMENT/PLAN/RECOMMENDATIONS:   Hyperthyroidism:  - Pt is clinically euthyroid - NO local neck symptoms  - TFTs remain within normal range, no change   Medication Continue methimazole 5 mg daily   2. Graves' Disease:    - No extra thyroidal manifestations of Graves' disease   F/U in 6 months    Signed electronically by: Natale Bail, MD  [facility] Endocrinology  [facility] [address] [place], [place] [address] Phone: [phone] FAX: [phone]   CC: Brendan Call, MD [address] [place] [address] Phone: [phone] Fax: [phone]   Return to Endocrinology clinic as below: Future Appointments  Date Time Provider Department Center  04/05/2024  2:00 PM Jhonny Moss, MD [facility] None  05/31/2024 12:10 PM Kaysen Deal, Julian Obey, MD [facility] None

## 2023-12-02 LAB — T4, FREE: Free T4: 1.2 ng/dL (ref 0.8–1.8)

## 2023-12-02 LAB — TSH: TSH: 0.47 m[IU]/L

## 2023-12-04 ENCOUNTER — Encounter: Payer: Self-pay | Admitting: Internal Medicine

## 2023-12-04 MED ORDER — METHIMAZOLE 5 MG PO TABS: 5.0000 mg | ORAL_TABLET | Freq: Every day | ORAL | 3 refills | Status: DC

## 2024-03-21 ENCOUNTER — Encounter: Payer: Self-pay | Admitting: Neurology

## 2024-03-21 ENCOUNTER — Ambulatory Visit: Admitting: Neurology

## 2024-03-21 VITALS — BP 98/57 | HR 87 | Ht 60.0 in | Wt 148.0 lb

## 2024-03-21 DIAGNOSIS — G40B09 Juvenile myoclonic epilepsy, not intractable, without status epilepticus: Secondary | ICD-10-CM | POA: Diagnosis not present

## 2024-03-21 MED ORDER — LEVETIRACETAM ER 500 MG PO TB24: ORAL_TABLET | ORAL | 3 refills | Status: AC

## 2024-03-21 NOTE — Patient Instructions (Signed)
 Good to see you! Continue Keppra  ER 500mg : Take 2 tablets twice a day. Follow-up in 8 months, call for any changes.    Seizure Precautions: 1. If medication has been prescribed for you to prevent seizures, take it exactly as directed.  Do not stop taking the medicine without talking to your doctor first, even if you have not had a seizure in a long time.   2. Avoid activities in which a seizure would cause danger to yourself or to others.  Don't operate dangerous machinery, swim alone, or climb in high or dangerous places, such as on ladders, roofs, or girders.  Do not drive unless your doctor says you may.  3. If you have any warning that you may have a seizure, lay down in a safe place where you can't hurt yourself.    4.  No driving for 6 months from last seizure, as per [place]  state law.   Please refer to the following link on the Epilepsy Foundation of America's website for more information: http://www.epilepsyfoundation.org/answerplace/Social/driving/drivingu.cfm   5.  Maintain good sleep hygiene. Avoid alcohol.  6.  Notify your neurology if you are planning pregnancy or if you become pregnant.  7.  Contact your doctor if you have any problems that may be related to the medicine you are taking.  8.  Call 911 and bring the patient back to the ED if:        A.  The seizure lasts longer than 5 minutes.       B.  The patient doesn't awaken shortly after the seizure  C.  The patient has new problems such as difficulty seeing, speaking or moving  D.  The patient was injured during the seizure  E.  The patient has a temperature over 102 F (39C)  F.  The patient vomited and now is having trouble breathing

## 2024-03-21 NOTE — Progress Notes (Signed)
 NEUROLOGY FOLLOW UP OFFICE NOTE  Megan Cruz [id number] 2001-03-31  HISTORY OF PRESENT ILLNESS: I had the pleasure of seeing Megan Cruz in follow-up in the neurology clinic on 03/21/2024.  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 She is alone in the office today. Records and images were personally reviewed where available.  She denies any seizures since 03/2023 (due to missed medication, Keppra  level in 07/2022 was <2.0). Repeat Keppra  level in 04/2023 was 19.8. No side effects on Levetiracetam  ER 1000mg  BID (500mg  2 tabs BID). She denies any staring/unresponsive episodes, gaps in time, olfactory/gustatory hallucinations, focal numbness/tingling/weakness, myoclonic jerks. No headaches, dizziness, vision changes, no falls. She gets 5-6 hours of sleep. Mood is good. She lives with her mother. She works at [employer]. She is driving. No pregnancy plans.    History on Initial Assessment 08/02/2021: This is a 23 year old right-handed woman with a history of newly diagnosed hyperthyroidism presenting for evaluation of seizures. She was evaluated by pediatric neurologist Dr. Corinthia in 01/2018 after an episode of eye rolling, confusion, and brief jerking when her mother woke her up in the middle of the night to take care of a baby. She remembered jerking and was able to go back to sleep. She noted occasional brief jerking in her arms when she wakes up in the morning. EEG at that time was normal. She did well for several years until 07/20/21 when she was brought to the ER for seizure-like activity. There was a family argument and she recalled going to her bedroom where her mother found her having generalized shaking for 1-2 minutes. She denies any prior warning symptoms. She woke up in her bed and found she bit her tongue and both legs were weak. She admitted to taking a THC gummy prior to the event, which she has had in the past with no issues. She was tachycardic in the  160s-170s, given IV fluids.  Bloodwork showed normal CBC, BMP. Her TSH was very low at <0.010, free T4 was 4.02, T3 425, she was started on methimazole . She was seen by Endocrinology and started on atenolol . She lives with her mother. She denies any staring/unresponsive episodes, gaps in time, olfactory/gustatory hallucinations, deja vu, rising epigastric sensation, focal numbness/tingling/weakness. She denies any headaches, dizziness, diplopia, dysarthria/dysphagia, neck/back pain, bowel/bladder dysfunction. Sleep is good until recently with her thyroid  medications, she wakes up at 2am but goes back to sleep. She works at [employer]. Her brother had a single seizure. Otherwise she had a normal birth and early development.  There is no history of febrile convulsions, CNS infections such as meningitis/encephalitis, significant traumatic brain injury, neurosurgical procedures.  Diagnostic Data: MRI with and without contrast done 08/2021 did not show any acute changes, hippocampi symmetric with no abnormal signal or enhancement seen. There was note of very small foci of increased T2 FLAIR signal in the bilateral cerebral white matter. There was prominence of palatine tonsils and lymph nodes in the upper neck.   1-hour EEG in 07/2021 was abnormal due to occasional generalized high voltage 4-5 Hz spike and polyspike and wave discharges lasting 1-3 seconds consistent with a primary generalized epilepsy.   PAST MEDICAL HISTORY: Past Medical History:  Diagnosis Date   Epilepsia    Hyperthyroidism    Hyperthyroidism complicating pregnancy 04/03/2022    MEDICATIONS: Current Outpatient Medications on File Prior to Visit  Medication Sig Dispense Refill   levETIRAcetam  (KEPPRA  XR) 500 MG 24 hr tablet Take 2 tablets  twice a day 360 tablet 3   methimazole  (TAPAZOLE ) 5 MG tablet Take 1 tablet (5 mg total) by mouth daily. 90 tablet 3   XULANE  150-35 MCG/24HR transdermal patch APPLY 1 PATCH ONCE A WEEK 12 patch 3    No current facility-administered medications on file prior to visit.    ALLERGIES: No Known Allergies  FAMILY HISTORY: Family History  Problem Relation Age of Onset   Migraines Sister    Seizures Neg Hx    Autism Neg Hx    ADD / ADHD Neg Hx    Anxiety disorder Neg Hx    Depression Neg Hx    Bipolar disorder Neg Hx    Schizophrenia Neg Hx     SOCIAL HISTORY: Social History   Socioeconomic History   Marital status: Single    Spouse name: Not on file   Number of children: Not on file   Years of education: Not on file   Highest education level: Not on file  Occupational History   Not on file  Tobacco Use   Smoking status: Never    Passive exposure: Yes   Smokeless tobacco: Never  Vaping Use   Vaping status: Never Used  Substance and Sexual Activity   Alcohol use: No   Drug use: No   Sexual activity: Yes    Partners: Male    Birth control/protection: None  Other Topics Concern   Not on file  Social History Narrative   Patient lives with mom  She has graduated and is not attending college at the moment.    Right handed    Two story apartment       Social Drivers of Health   Financial Resource Strain: Not on file  Food Insecurity: Not on file  Transportation Needs: Not on file  Physical Activity: Not on file  Stress: Not on file  Social Connections: Not on file  Intimate Partner Violence: Not on file     PHYSICAL EXAM: Vitals:   03/21/24 1317  BP: (!) 98/57  Pulse: 87  SpO2: 99%   General: No acute distress Head:  Normocephalic/atraumatic Skin/Extremities: No rash, no edema Neurological Exam: alert and awake. No aphasia or dysarthria. Fund of knowledge is appropriate.  Attention and concentration are normal.   Cranial nerves: Pupils equal, round. Extraocular movements intact with no nystagmus. Visual fields full.  No facial asymmetry.  Motor: Bulk and tone normal, muscle strength 5/5 throughout with no pronator drift.   Finger to nose testing  intact.  Gait narrow-based and steady, able to tandem walk adequately.  Romberg negative.   IMPRESSION: This is a pleasant 23 yo RH woman with a history of hyperthyroidism, with Juvenile Myoclonic Epilepsy. EEG showed generalized 4-5 Hz spike and polyspike and wave discharges. MRI brain unremarkable. She is doing well seizure-free since 03/2023 (missed medication), taking Levetiracetam  ER 1000mg  BID, refills sent. We again discussed avoidance of seizure triggers. She is aware of [place] driving laws to stop driving after a seizure until 6 months seizure-free. Follow-up in 8 months, call for any changes.   Thank you for allowing me to participate in her care.  Please do not hesitate to call for any questions or concerns.    Darice Shivers, M.D.

## 2024-03-29 ENCOUNTER — Ambulatory Visit: Payer: Medicaid Other | Admitting: Neurology

## 2024-04-05 ENCOUNTER — Ambulatory Visit: Payer: Medicaid Other | Admitting: Neurology

## 2024-05-31 ENCOUNTER — Ambulatory Visit (INDEPENDENT_AMBULATORY_CARE_PROVIDER_SITE_OTHER): Admitting: Internal Medicine

## 2024-05-31 ENCOUNTER — Encounter: Payer: Self-pay | Admitting: Internal Medicine

## 2024-05-31 VITALS — BP 110/72 | HR 67 | Ht 60.0 in | Wt 144.8 lb

## 2024-05-31 DIAGNOSIS — E0590 Thyrotoxicosis, unspecified without thyrotoxic crisis or storm: Secondary | ICD-10-CM | POA: Diagnosis not present

## 2024-05-31 DIAGNOSIS — E0500 Thyrotoxicosis with diffuse goiter without thyrotoxic crisis or storm: Secondary | ICD-10-CM

## 2024-05-31 NOTE — Progress Notes (Signed)
 Name: Megan Cruz  MRN/ DOB: [id number], [birth year]    Age/ Sex: 23 y.o., female    PCP: Patient, No Pcp Per   Reason for Endocrinology Evaluation: Hyperthyroidism     Date of Initial Endocrinology Evaluation: 07/27/2021    HPI: Megan Cruz is a 23 y.o. female with a past medical history of hyperthyroidism and seizure disorder.  The patient presented for initial endocrinology clinic visit on 07/27/2021  for consultative assistance with her Hyperthyroidism.   She was diagnosed with hyperthyroidism during evaluation of tachycardia on 07/21/2021 with a suppressed TSH <0.01 uIU/mL and elevated FT4 4.02 ng/dL and T3 at 574 ng/dL . She was started on Methimazole  at the time.    Prior to starting methimazole  she  did not notice any weight loss, local neck symptoms but noted hand tremors . No palpitations except the day she presented to the ED   On her initial visit to  our clinic she was on Methimazole  but we switched to PTU shortly after pregnancy diagnosis in 07/2021  TRAB elevated at 6.37 IU/L Sister with hypothyroidism   Patient was lost to follow-up after March 2023  She is S/P delivery 03/2022- baby girl   Upon her return to our clinic 10/2022 she has been off methimazole  since 8/ 2023, with normal TFTs   SUBJECTIVE:    Today (05/31/24):  [name] is here for a follow up for hyperthyroidism .    She continues to follow-up with neurology for history of seizure disorder, she is on Keppra  No local neck swelling  No palpitations  No tremors  No constipation or diarrhea  No eye symptoms LMP  last month - on Xulane     Methimazole  5 mg daily   HISTORY:  Past Medical History:  Past Medical History:  Diagnosis Date   Epilepsia    Hyperthyroidism    Hyperthyroidism complicating pregnancy 04/03/2022   Past Surgical History:  Past Surgical History:  Procedure Laterality Date   APPENDECTOMY     CYST REMOVAL PEDIATRIC Right 07/22/2013   Procedure: CYST  REMOVAL PEDIATRIC;  Surgeon: CHRISTELLA. Julietta Millman, MD;  Location: [facility] OR;  Service: Pediatrics;  Laterality: Right;   LAPAROSCOPIC APPENDECTOMY N/A 07/22/2013   Procedure: APPENDECTOMY LAPAROSCOPIC;  Surgeon: CHRISTELLA. Julietta Millman, MD;  Location: [facility] OR;  Service: Pediatrics;  Laterality: N/A;    Social History:  reports that she has never smoked. She has been exposed to tobacco smoke. She has never used smokeless tobacco. She reports that she does not drink alcohol and does not use drugs. Family History: family history includes Migraines in her sister.   HOME MEDICATIONS: Allergies as of 05/31/2024   No Known Allergies      Medication List        Accurate as of May 31, 2024 12:20 PM. If you have any questions, ask your nurse or doctor.          levETIRAcetam  500 MG 24 hr tablet Commonly known as: KEPPRA  XR Take 2 tablets twice a day   methimazole  5 MG tablet Commonly known as: TAPAZOLE  Take 1 tablet (5 mg total) by mouth daily.   Xulane  150-35 MCG/24HR transdermal patch Generic drug: norelgestromin-ethinyl estradiol APPLY 1 PATCH ONCE A WEEK          REVIEW OF SYSTEMS: A comprehensive ROS was conducted with the patient and is negative except as per HPI     OBJECTIVE:  VS: BP 110/72 (BP Location: Left Arm, Patient Position: Sitting, Cuff  Size: Small)   Pulse 67   Ht 5' (1.524 m)   Wt 144 lb 12.8 oz (65.7 kg)   SpO2 99%   BMI 28.28 kg/m    Wt Readings from Last 3 Encounters:  05/31/24 144 lb 12.8 oz (65.7 kg)  03/21/24 148 lb (67.1 kg)  12/01/23 152 lb (68.9 kg)     EXAM: General: Pt appears well and is in NAD  Eyes: External eye exam normal without stare, lid lag or exophthalmos.  EOM intact.   Neck: General: Supple without adenopathy. Thyroid : Thyroid  size normal.  No goiter or nodules appreciated  Lungs: Clear with good BS bilat   Heart: Auscultation: RRR  Abdomen: soft, non tender  Extremities:  BL LE: No pretibial edema normal  Mental Status:  Judgment, insight: Intact Orientation: Oriented to time, place, and person Mood and affect: No depression, anxiety, or agitation     DATA REVIEWED:   Latest Reference Range & Units 12/01/23 12:34  TSH mIU/L 0.47  T4,Free(Direct) 0.8 - 1.8 ng/dL 1.2        Latest Reference Range & Units Most Recent  TRAB <=2.00 IU/L 6.37 (H) 07/27/21 10:44    ASSESSMENT/PLAN/RECOMMENDATIONS:   Hyperthyroidism:  - Patient is clinically euthyroid -No local neck symptoms -TFTs showed low TSH, will increase methimazole  as below   Medication Change methimazole  5 mg , 2 tablets on Sundays, and 1 tablet the rest of the week   2. Graves' Disease:    - No extra thyroidal manifestations of Graves' disease - Up-to-date on eye exam   F/U in 6 months    Signed electronically by: Stefano Redgie Butts, MD  [facility] Endocrinology  [facility] [address] [place], [place] [address] Phone: [phone] FAX: [phone]   CC: Patient, No Pcp Per No address on file Phone: None Fax: None   Return to Endocrinology clinic as below: Future Appointments  Date Time Provider Department Center  10/28/2024  2:00 PM Georjean Darice HERO, MD [id number] None

## 2024-06-03 ENCOUNTER — Other Ambulatory Visit

## 2024-06-04 ENCOUNTER — Ambulatory Visit: Payer: Self-pay | Admitting: Internal Medicine

## 2024-06-04 LAB — T4, FREE: Free T4: 1.3 ng/dL (ref 0.8–1.8)

## 2024-06-04 LAB — TSH: TSH: 0.12 m[IU]/L — ABNORMAL LOW

## 2024-06-04 LAB — T3, FREE: T3, Free: 3.8 pg/mL (ref 2.3–4.2)

## 2024-06-04 MED ORDER — METHIMAZOLE 5 MG PO TABS: 5.0000 mg | ORAL_TABLET | ORAL | 3 refills | Status: AC

## 2024-07-04 ENCOUNTER — Encounter: Payer: Self-pay | Admitting: Neurology

## 2024-10-28 ENCOUNTER — Ambulatory Visit: Admitting: Neurology

## 2024-11-29 ENCOUNTER — Ambulatory Visit: Admitting: Internal Medicine
